# Patient Record
Sex: Female | Born: 1990 | Race: White | Hispanic: No | Marital: Single | Smoking: Never smoker
Health system: Southern US, Community
[De-identification: ages and names within clinical notes are randomized; demographics above are authoritative.]

## PROBLEM LIST (undated history)

## (undated) DIAGNOSIS — Z789 Other specified health status: Secondary | ICD-10-CM

## (undated) DIAGNOSIS — B009 Herpesviral infection, unspecified: Secondary | ICD-10-CM

## (undated) HISTORY — DX: Herpesviral infection, unspecified: B00.9

## (undated) HISTORY — DX: Other specified health status: Z78.9

## (undated) HISTORY — PX: WISDOM TOOTH EXTRACTION: SHX21

---

## 2016-05-02 DIAGNOSIS — N390 Urinary tract infection, site not specified: Secondary | ICD-10-CM | POA: Diagnosis not present

## 2017-05-17 ENCOUNTER — Other Ambulatory Visit: Payer: Self-pay

## 2017-05-17 ENCOUNTER — Ambulatory Visit: Payer: BLUE CROSS/BLUE SHIELD | Admitting: Family Medicine

## 2017-05-17 ENCOUNTER — Encounter: Payer: Self-pay | Admitting: Family Medicine

## 2017-05-17 VITALS — BP 100/62 | HR 105 | Temp 98.0°F | Resp 16 | Ht 62.0 in | Wt 118.0 lb

## 2017-05-17 DIAGNOSIS — J069 Acute upper respiratory infection, unspecified: Secondary | ICD-10-CM | POA: Diagnosis not present

## 2017-05-17 DIAGNOSIS — H66003 Acute suppurative otitis media without spontaneous rupture of ear drum, bilateral: Secondary | ICD-10-CM

## 2017-05-17 MED ORDER — FLUTICASONE PROPIONATE 50 MCG/ACT NA SUSP: 2.0000 | Freq: Every day | NASAL | 6 refills | Status: DC

## 2017-05-17 MED ORDER — CEFDINIR 300 MG PO CAPS: 600.0000 mg | ORAL_CAPSULE | Freq: Every day | ORAL | 0 refills | Status: DC

## 2017-05-17 NOTE — Progress Notes (Signed)
Patient ID: Ashley KeelerHeather Greene, female    DOB: 1991/04/15  Age: 26 y.o. MRN: [id number]  Chief Complaint  Patient presents with  . Ear Pain    x 3 days     Subjective:   26 year old lady with right ear pain, and now some on the left. She had a little cold like an infection. She has had some headache congestion and cough, but nothing severe. She did have fevers. She has a history of a lot of ear infections as a child. 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 She has a history of having had her eardrum rupture on several occasions. She does not feel like she needs anything major pain at this time.  Current allergies, medications, problem list, past/family and social histories reviewed.  Objective:  BP 100/62   Pulse (!) 105   Temp 98 F (36.7 C)   Resp 16   Ht 5\' 2"  (1.575 m)   Wt 118 lb (53.5 kg)   LMP 04/22/2017   SpO2 98%   BMI 21.58 kg/m   Healthy-appearing young lady. Bulging right eardrum with erythema. Left eardrum is erythematous primarily superiorly, not suppurative like the right. Nose slightly congested. Throat clear. Neck supple without significant nodes. Chest clear. Heart regular without murmur.  Assessment & Plan:   Assessment: 1. Acute suppurative otitis media of both ears without spontaneous rupture of tympanic membranes, recurrence not specified   2. Acute upper respiratory infection       Plan: Treat for the acute otitis media. Return if needed.  No orders of the defined types were placed in this encounter.   Meds ordered this encounter  Medications  . cefdinir (OMNICEF) 300 MG capsule    Sig: Take 2 capsules (600 mg total) by mouth daily.    Dispense:  20 capsule    Refill:  0  . fluticasone (FLONASE) 50 MCG/ACT nasal spray    Sig: Place 2 sprays into both nostrils daily.    Dispense:  16 g    Refill:  6         Patient Instructions   Drink plenty of fluids to stay well hydrated  If necessary take  Tylenol (acetaminophen) 500 mg 2 pills maximum of 3 times daily or ibuprofen 200 mg 3 pills 3 times daily for pain or fever.  Omnicef (cefdinir) 300 mg pill twice daily for infection  Use the fluticasone nose spray 2 sprays each nostril twice daily for 3 days, then reduce to once daily  Return if worse or further concerns.    IF you received an x-ray today, you will receive an invoice from [facility]. Please contact [facility] at [phone] with questions or concerns regarding your invoice.   IF you received labwork today, you will receive an invoice from [facility]. Please contact [facility] at [phone] with questions or concerns regarding your invoice.   Our billing staff will not be able to assist you with questions regarding bills from these companies.  You will be contacted with the lab results as soon as they are available. The fastest way to get your results is to activate your My Chart account. Instructions are located on the last page of this paperwork. If you have not heard from us regarding the results in 2 weeks, please contact this office.         Return if symptoms worsen or fail to improve.   HOPPER,DAVID, MD 05/17/2017

## 2017-05-17 NOTE — Patient Instructions (Addendum)
Drink plenty of fluids to stay well hydrated  If necessary take Tylenol (acetaminophen) 500 mg 2 pills maximum of 3 times daily or ibuprofen 200 mg 3 pills 3 times daily for pain or fever.  Omnicef (cefdinir) 300 mg pill twice daily for infection  Use the fluticasone nose spray 2 sprays each nostril twice daily for 3 days, then reduce to once daily  Return if worse or further concerns.    IF you received an x-ray today, you will receive an invoice from [facility]. Please contact [facility] at [phone] with questions or concerns regarding your invoice.   IF you received labwork today, you will receive an invoice from [facility]. Please contact [facility] at [phone] with questions or concerns regarding your invoice.   Our billing staff will not be able to assist you with questions regarding bills from these companies.  You will be contacted with the lab results as soon as they are available. The fastest way to get your results is to activate your My Chart account. Instructions are located on the last page of this paperwork. If you have not heard from us regarding the results in 2 weeks, please contact this office.

## 2017-11-16 DIAGNOSIS — N390 Urinary tract infection, site not specified: Secondary | ICD-10-CM | POA: Diagnosis not present

## 2018-05-24 ENCOUNTER — Other Ambulatory Visit: Payer: Self-pay

## 2018-05-24 ENCOUNTER — Encounter (HOSPITAL_COMMUNITY): Payer: Self-pay | Admitting: Emergency Medicine

## 2018-05-24 ENCOUNTER — Ambulatory Visit (HOSPITAL_COMMUNITY)
Admission: EM | Admit: 2018-05-24 | Discharge: 2018-05-24 | Disposition: A | Discharge status: Home / Self Care | Payer: BLUE CROSS/BLUE SHIELD

## 2018-05-24 DIAGNOSIS — R69 Illness, unspecified: Secondary | ICD-10-CM | POA: Diagnosis not present

## 2018-05-24 DIAGNOSIS — H1032 Unspecified acute conjunctivitis, left eye: Secondary | ICD-10-CM | POA: Diagnosis not present

## 2018-05-24 DIAGNOSIS — J111 Influenza due to unidentified influenza virus with other respiratory manifestations: Secondary | ICD-10-CM

## 2018-05-24 MED ORDER — OSELTAMIVIR PHOSPHATE 75 MG PO CAPS: 75.0000 mg | ORAL_CAPSULE | Freq: Two times a day (BID) | ORAL | 0 refills | Status: DC

## 2018-05-24 MED ORDER — ERYTHROMYCIN 5 MG/GM OP OINT: TOPICAL_OINTMENT | OPHTHALMIC | 0 refills | Status: DC

## 2018-05-24 MED ORDER — IBUPROFEN 600 MG PO TABS: 600.0000 mg | ORAL_TABLET | Freq: Four times a day (QID) | ORAL | 0 refills | Status: DC | PRN

## 2018-05-24 MED ORDER — ACETAMINOPHEN 325 MG PO TABS
650.0000 mg | ORAL_TABLET | Freq: Once | ORAL | Status: AC
  Administered 2018-05-24: 650 mg via ORAL

## 2018-05-24 MED ORDER — ACETAMINOPHEN 325 MG PO TABS
ORAL_TABLET | ORAL | Status: AC
  Filled 2018-05-24: qty 2

## 2018-05-24 NOTE — ED Provider Notes (Signed)
[facility]    CSN: [id number] Arrival date & time: 05/24/18  1248     History   Chief Complaint Chief Complaint  Patient presents with  . URI  . Appointment    1:00pm    HPI Ashley Greene is a 27 y.o. female.   Subjective:   Ashley Greene is a 27 y.o. female who presents for evaluation of influenza like symptoms. Symptoms include low grade fevers as well as hot and cold spells, chills and myalgias and have been present for 2 days. She has tried to alleviate the symptoms with rest and dayquil with some relief. High risk factors for influenza complications: none.   Ashley Greene also complaints redness in the left eye for 1 day. Onset was acute. Symptoms have included discharge and itching. Patient denies blurred vision, pain, photophobia and visual field deficit. There is no history of allergies, contact lens use, exposure to chemicals, trauma or contacts with similar symptoms.   The following portions of the patient's history were reviewed and updated as appropriate: allergies, current medications, past family history, past medical history, past social history, past surgical history and problem list.        History reviewed. No pertinent past medical history.  There are no active problems to display for this patient.   History reviewed. No pertinent surgical history.  OB History   None      Home Medications    Prior to Admission medications   Medication Sig Start Date End Date Taking? Authorizing Provider  Pseudoephedrine-APAP-DM (DAYQUIL PO) Take by mouth.   Yes [provider]  erythromycin ophthalmic ointment Place a 1/2 inch ribbon of ointment into the left lower eyelid four times daily x 5 days 05/24/18   Lurline Idol, FNP  ibuprofen (ADVIL,MOTRIN) 600 MG tablet Take 1 tablet (600 mg total) by mouth every 6 (six) hours as needed. 05/24/18   Lurline Idol, FNP  oseltamivir (TAMIFLU) 75 MG capsule Take 1 capsule (75 mg total) by  mouth every 12 (twelve) hours. 05/24/18   Lurline Idol, FNP    Family History Family History  Problem Relation Age of Onset  . Healthy Mother   . Healthy Father     Social History Social History   Tobacco Use  . Smoking status: Never Smoker  . Smokeless tobacco: Never Used  Substance Use Topics  . Alcohol use: No    Frequency: Never  . Drug use: No     Allergies   Patient has no known allergies.   Review of Systems Review of Systems  Constitutional: Positive for fever.  HENT: Negative.   Eyes: Positive for discharge and redness.  Respiratory: Negative.   Musculoskeletal: Positive for myalgias.  All other systems reviewed and are negative.    Physical Exam Triage Vital Signs ED Triage Vitals  Enc Vitals Group     BP 05/24/18 1314 109/61     Pulse Rate 05/24/18 1314 (!) 111     Resp 05/24/18 1314 18     Temp 05/24/18 1314 (!) 100.6 F (38.1 C)     Temp Source 05/24/18 1314 Oral     SpO2 05/24/18 1314 100 %     Weight --      Height --      Head Circumference --      Peak Flow --      Pain Score 05/24/18 1311 2     Pain Loc --      Pain Edu? --  Excl. in GC? --    No data found.  Updated Vital Signs BP 109/61 (BP Location: Left Arm)   Pulse (!) 111   Temp (!) 100.6 F (38.1 C) (Oral)   Resp 18   LMP 05/14/2018   SpO2 100%   Visual Acuity Right Eye Distance: 20/20 Left Eye Distance: 20/20 Bilateral Distance: 20/15  Right Eye Near:   Left Eye Near:    Bilateral Near:     Physical Exam  Constitutional: She is oriented to person, place, and time. She appears well-developed and well-nourished.  HENT:  Head: Normocephalic.  Right Ear: External ear normal.  Left Ear: External ear normal.  Nose: Nose normal.  Mouth/Throat: Oropharynx is clear and moist.  Eyes: Pupils are equal, round, and reactive to light. EOM are normal. Left conjunctiva is injected.  Neck: Normal range of motion. Neck supple.  Cardiovascular: Normal rate and  regular rhythm.  Pulmonary/Chest: Effort normal and breath sounds normal.  Musculoskeletal: Normal range of motion.  Lymphadenopathy:    She has no cervical adenopathy.  Neurological: She is alert and oriented to person, place, and time.  Skin: Skin is warm and dry.  Psychiatric: She has a normal mood and affect.     UC Treatments / Results  Labs (all labs ordered are listed, but only abnormal results are displayed) Labs Reviewed - No data to display  EKG None  Radiology No results found.  Procedures Procedures (including critical care time)  Medications Ordered in UC Medications  acetaminophen (TYLENOL) tablet 650 mg (has no administration in time range)    Initial Impression / Assessment and Plan / UC Course  I have reviewed the triage vital signs and the nursing notes.  Pertinent labs & imaging results that were available during my care of the patient were reviewed by me and considered in my medical decision making (see chart for details).     27 year old female presenting with influenza-like symptoms and left eye conjunctivitis.  Patient is febrile and mildly tachycardic.  She is nontoxic-appearing and the remainder of her exam is unremarkable.  Supportive care advised at this time.   Plan:  Ophthalmic ointment per orders. Supportive care with appropriate antipyretics and fluids. Educational material distributed and questions answered. Antivirals per orders. Follow-up as needed   Today's evaluation has revealed no signs of a dangerous process. Discussed diagnosis with patient. Patient aware of their diagnosis, possible red flag symptoms to watch out for and need for close follow up. Patient understands verbal and written discharge instructions. Patient comfortable with plan and disposition.  Patient has a clear mental status at this time, good insight into illness (after discussion and teaching) and has clear judgment to make decisions regarding their  care.  Documentation was completed with the aid of voice recognition software. Transcription may contain typographical errors. Final Clinical Impressions(s) / UC Diagnoses   Final diagnoses:  Influenza-like illness  Acute conjunctivitis of left eye, unspecified acute conjunctivitis type     Discharge Instructions     Take medications as prescribed.  Rest and drink lots of fluids.  Follow-up as needed.    ED Prescriptions    Medication Sig Dispense Auth. Provider   oseltamivir (TAMIFLU) 75 MG capsule Take 1 capsule (75 mg total) by mouth every 12 (twelve) hours. 10 capsule Lurline IdolMurrill, Ovila Lepage, FNP   erythromycin ophthalmic ointment Place a 1/2 inch ribbon of ointment into the left lower eyelid four times daily x 5 days 1 g Lurline IdolMurrill, Suri Tafolla, FNP   ibuprofen (  ADVIL,MOTRIN) 600 MG tablet Take 1 tablet (600 mg total) by mouth every 6 (six) hours as needed. 30 tablet Lurline Idol, FNP     Controlled Substance Prescriptions [place] Controlled Substance Registry consulted? Not Applicable   Lurline Idol, FNP 05/24/18 1344

## 2018-05-24 NOTE — Discharge Instructions (Addendum)
Take medications as prescribed.  Rest and drink lots of fluids.  Follow-up as needed.

## 2018-05-24 NOTE — ED Triage Notes (Signed)
Left eye redness and irritation.  Eye symptoms started last night.  No vision changes.  No known injury   Chills, fever, initially had a sore throat.  Patient reports temperature "99".

## 2018-06-26 DIAGNOSIS — R5383 Other fatigue: Secondary | ICD-10-CM | POA: Diagnosis not present

## 2018-06-27 DIAGNOSIS — N760 Acute vaginitis: Secondary | ICD-10-CM | POA: Diagnosis not present

## 2018-06-27 DIAGNOSIS — Z124 Encounter for screening for malignant neoplasm of cervix: Secondary | ICD-10-CM | POA: Diagnosis not present

## 2019-02-27 DIAGNOSIS — B373 Candidiasis of vulva and vagina: Secondary | ICD-10-CM | POA: Diagnosis not present

## 2019-05-23 DIAGNOSIS — N760 Acute vaginitis: Secondary | ICD-10-CM | POA: Diagnosis not present

## 2019-05-24 DIAGNOSIS — S161XXA Strain of muscle, fascia and tendon at neck level, initial encounter: Secondary | ICD-10-CM | POA: Diagnosis not present

## 2019-06-06 ENCOUNTER — Ambulatory Visit: Payer: BLUE CROSS/BLUE SHIELD | Admitting: Family Medicine

## 2019-06-17 DIAGNOSIS — H5213 Myopia, bilateral: Secondary | ICD-10-CM | POA: Diagnosis not present

## 2019-06-21 NOTE — L&D Delivery Note (Addendum)
Ashley Greene is a 29 y.o. G1P1001 s/p vaginal delivery at [redacted]w[redacted]d.  She was admitted for labor.   ROM: 4h 65m with light meconium stained fluid GBS Status: positive (pt denies antibiotics and has been educated on risks) Maximum Maternal Temperature: 98.5 F   Labor Progress: Pt in latent labor on admission.  She continued to progress well without augmentation. Given pt's desire to augment, AROM was performed for light meconium stained fluid. Patient continued to progress well. She then delivered without complication as noted below.   Delivery Date/Time: 05/20/2020 @ 1201 Delivery: Called to room and patient was complete and pushing. Head delivered ROA. No nuchal cord present. Shoulder and body delivered in usual fashion. Infant with spontaneous cry, placed on mother's abdomen, dried and stimulated. Cord clamped x 2 after 1-minute delay, and cut by FOB under my direct supervision. Cord blood drawn. Placenta delivered spontaneously with gentle cord traction. Fundus firm with massage. Labia, perineum, vagina, and cervix were inspected; hemostatic right periurethral abrasion and hemostatic left labial abrasion; no repair needed.    Placenta: intact, 3-vessel cord, sent to L&D Complications: none Lacerations: hemostatic right periurethral abrasion and hemostatic left labial abrasion; no repair needed EBL: 100 ml Analgesia: epidural   Infant: female  APGARs 8 & 9  weight per medical record  Colman Cater, Student-PA  05/20/2020 @1236   I was present and gloved for the entirety of the delivery. I agree with the findings and the plan of care as documented in the PA student's note.  , MD [facility] Family Medicine Fellow, [facility] for [facility], [facility]

## 2019-06-28 ENCOUNTER — Ambulatory Visit: Payer: Self-pay | Admitting: Family Medicine

## 2019-07-07 DIAGNOSIS — A6003 Herpesviral cervicitis: Secondary | ICD-10-CM | POA: Diagnosis not present

## 2019-07-11 DIAGNOSIS — B009 Herpesviral infection, unspecified: Secondary | ICD-10-CM | POA: Diagnosis not present

## 2019-07-17 DIAGNOSIS — R21 Rash and other nonspecific skin eruption: Secondary | ICD-10-CM | POA: Diagnosis not present

## 2019-07-17 DIAGNOSIS — N760 Acute vaginitis: Secondary | ICD-10-CM | POA: Diagnosis not present

## 2019-07-18 MED FILL — TINIDAZOLE 500 MG TABS: 500 | 5 days supply | Qty: 10 | Fill #0

## 2019-07-19 DIAGNOSIS — L42 Pityriasis rosea: Secondary | ICD-10-CM | POA: Diagnosis not present

## 2019-08-05 DIAGNOSIS — B373 Candidiasis of vulva and vagina: Secondary | ICD-10-CM | POA: Diagnosis not present

## 2019-08-05 DIAGNOSIS — A6004 Herpesviral vulvovaginitis: Secondary | ICD-10-CM | POA: Diagnosis not present

## 2019-08-06 DIAGNOSIS — R5383 Other fatigue: Secondary | ICD-10-CM | POA: Diagnosis not present

## 2019-08-07 DIAGNOSIS — R5383 Other fatigue: Secondary | ICD-10-CM | POA: Diagnosis not present

## 2019-08-08 DIAGNOSIS — A6004 Herpesviral vulvovaginitis: Secondary | ICD-10-CM | POA: Diagnosis not present

## 2019-08-12 DIAGNOSIS — N898 Other specified noninflammatory disorders of vagina: Secondary | ICD-10-CM | POA: Diagnosis not present

## 2019-09-24 DIAGNOSIS — Z3201 Encounter for pregnancy test, result positive: Secondary | ICD-10-CM | POA: Diagnosis not present

## 2019-10-09 ENCOUNTER — Telehealth: Payer: Self-pay | Admitting: Family Medicine

## 2019-10-09 DIAGNOSIS — L42 Pityriasis rosea: Secondary | ICD-10-CM | POA: Diagnosis not present

## 2019-10-09 NOTE — Telephone Encounter (Signed)
Patient called in the office stating that she thinks her appointment got scheduled at the wrong office. She stated that it is scheduled at the [facility] office but she lives here in [place] and would like to come the this office instead because it is closer. Patient advised that our intake appointment schedule is not out yet and once it is I can give her a call back to get these appointments set up for her. Patient wanted to know if she could keep her appointment in [facility] on 4/27 and then have the rest of her appointments at our office. Patient instructed that this could be done and once she has her appointment with [facility] to give the office a call do that we can have her future appointments at our office. Patient verbalized understanding and no further questions were had

## 2019-10-15 ENCOUNTER — Other Ambulatory Visit: Payer: Self-pay

## 2019-10-15 ENCOUNTER — Encounter: Payer: Self-pay | Admitting: Advanced Practice Midwife

## 2019-10-15 ENCOUNTER — Other Ambulatory Visit (HOSPITAL_COMMUNITY)
Admission: RE | Admit: 2019-10-15 | Discharge: 2019-10-15 | Disposition: A | Discharge status: Home / Self Care | Payer: BC Managed Care – PPO | Source: Ambulatory Visit | Attending: Advanced Practice Midwife | Admitting: Advanced Practice Midwife

## 2019-10-15 ENCOUNTER — Ambulatory Visit (INDEPENDENT_AMBULATORY_CARE_PROVIDER_SITE_OTHER): Payer: BC Managed Care – PPO | Admitting: Advanced Practice Midwife

## 2019-10-15 DIAGNOSIS — B009 Herpesviral infection, unspecified: Secondary | ICD-10-CM | POA: Diagnosis not present

## 2019-10-15 DIAGNOSIS — Z3400 Encounter for supervision of normal first pregnancy, unspecified trimester: Secondary | ICD-10-CM | POA: Diagnosis not present

## 2019-10-15 HISTORY — DX: Encounter for supervision of normal first pregnancy, unspecified trimester: Z34.00

## 2019-10-15 NOTE — Progress Notes (Signed)
  Subjective:    Ashley Greene is a G1P0 [redacted]w[redacted]d being seen today for her first obstetrical visit.  Her obstetrical history is significant for primigravida, history of HSV. Patient does intend to breast feed. Pregnancy history fully reviewed.  Patient reports no complaints.   Prefers to go to [facility], but made a mistake when making her appointment.   Vitals:   10/15/19 1051  BP: (!) 93/57  Pulse: 77  Weight: 112 lb 1.3 oz (50.8 kg)    HISTORY: OB History  Gravida Para Term Preterm AB Living  1            SAB TAB Ectopic Multiple Live Births               # Outcome Date GA Lbr Len/2nd Weight Sex Delivery Anes PTL Lv  1 Current            History reviewed. No pertinent past medical history. History reviewed. No pertinent surgical history. Family History  Problem Relation Age of Onset  . Healthy Mother   . Healthy Father      Exam    Uterus:     Pelvic Exam:    Perineum: Normal Perineum   Vulva: Bartholin's, Urethra, Skene's normal   Vagina:  normal mucosa, normal discharge   pH:    Cervix: no cervical motion tenderness   Adnexa: no mass, fullness, tenderness   Bony Pelvis: gynecoid  System: Breast:  normal appearance, no masses or tenderness   Skin: normal coloration and turgor, no rashes    Neurologic: oriented, grossly non-focal   Extremities: normal strength, tone, and muscle mass   HEENT neck supple with midline trachea   Mouth/Teeth mucous membranes moist, pharynx normal without lesions   Neck supple   Cardiovascular: regular rate and rhythm   Respiratory:  appears well, vitals normal, no respiratory distress, acyanotic, normal RR, ear and throat exam is normal, neck free of mass or lymphadenopathy, chest clear, no wheezing, crepitations, rhonchi, normal symmetric air entry   Abdomen: soft, non-tender; bowel sounds normal; no masses,  no organomegaly   Urinary: urethral meatus normal   Bedside informal US done to obtaine FHR.  FH motion detected  at 150. + fetal movement.    Assessment:    Pregnancy: G1P0 Patient Active Problem List   Diagnosis Date Noted  . Supervision of normal first pregnancy, antepartum 10/15/2019  . 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 Continue prenatal vitamins. Discussed and offered genetic screening options, including Quad screen/AFP, NIPS testing, and option to decline testing. Benefits/risks/alternatives reviewed. Pt aware that anatomy US is form of genetic screening with lower accuracy in detecting trisomies than blood work.  Pt chooses genetic screening today. NIPS: ordered. Ultrasound discussed; fetal anatomic survey: ordered. Problem list reviewed and updated. The nature of [facility] with multiple MDs and other Advanced Practice Providers was explained to patient; also emphasized that residents, students are part of our team. Routine obstetric precautions reviewed.     Follow up in 4 weeks. 50% of 30 min visit spent on counseling and coordination of care.     Wynelle Bourgeois 10/15/2019

## 2019-10-15 NOTE — Patient Instructions (Signed)
Genetic Testing During Pregnancy Genetic testing during pregnancy is also called prenatal genetic testing. This type of testing can determine if your baby is at risk of being born with a disorder caused by abnormal genes or chromosomes (genetic disorder). Chromosomes contain genes that control how your baby will develop in your womb. There are many different genetic disorders. Examples of genetic disorders that may be found through genetic testing include Down syndrome and cystic fibrosis. Gene changes (mutations) can be passed down through families. Genetic testing is offered to all women before or during pregnancy. You can choose whether to have genetic testing. Why is genetic testing done? Genetic testing is done during pregnancy to find out whether your child is at risk for a genetic disorder. Having genetic testing allows you to:  Discuss your test results and options with a genetic counselor.  Prepare for a baby that may be born with a genetic disorder. Learning about the disorder ahead of time helps you be better prepared to manage it. Your health care providers can also be prepared in case your baby requires special care before or after birth.  Consider whether you want to continue with the pregnancy. In some cases, genetic testing may be done to learn about the traits a child will inherit. Types of genetic tests There are two basic types of genetic testing. Screening tests indicate whether your developing baby (fetus) is at higher risk for a genetic disorder. Diagnostic tests check actual fetal cells to diagnose a genetic disorder. Screening tests     Screening tests will not harm your baby. They are recommended for all pregnant women. Types of screening tests include:  Carrier screening. This test involves checking genes from both parents by testing their blood or saliva. The test checks to find out if the parents carry a genetic mutation that may be passed to a baby. In most cases,  both parents must carry the mutation for a baby to be at risk.  First trimester screening. This test combines a blood test with sound wave imaging of your baby (fetal ultrasound). This screening test checks for a risk of Down syndrome or other defects caused by having extra chromosomes. It also checks for defects of the heart, abdomen, or skeleton.  Second trimester screening also combines a blood test with a fetal ultrasound exam. It checks for a risk of genetic defects of the face, brain, spine, heart, or limbs.  Combined or sequential screening. This type of testing combines the results of first and second trimester screening. This type of testing may be more accurate than first or second trimester screening alone.  Cell-free DNA testing. This is a blood test that detects cells released by the placenta that get into the mother's blood. It can be used to check for a risk of Down syndrome, other extra chromosome syndromes, and disorders caused by abnormal numbers of sex chromosomes. This test can be done any time after 10 weeks of pregnancy.  Diagnostic tests Diagnostic tests carry slight risks of problems, including bleeding, infection, and loss of the pregnancy. These tests are done only if your baby is at risk for a genetic disorder. You may meet with a genetic counselor to discuss the risks and benefits before having diagnostic tests. Examples of diagnostic tests include:  Chorionic villus sampling (CVS). This involves a procedure to remove and test a sample of cells taken from the placenta. The procedure may be done between 10 and 12 weeks of pregnancy.  Amniocentesis. This involves a   procedure to remove and test a sample of fluid (amniotic fluid) and cells from the sac that surrounds the developing baby. The procedure may be done between 15 and 20 weeks of pregnancy. What do the results mean? For a screening test:  If the results are negative, it often means that your child is not at higher  risk. There is still a slight chance your child could have a genetic disorder.  If the results are positive, it does not mean your child will have a genetic disorder. It may mean that your child has a higher-than-normal risk for a genetic disorder. In that case, you may want to talk with a genetic counselor about whether you should have diagnostic genetic tests. For a diagnostic test:  If the result is negative, it is unlikely that your child will have a genetic disorder.  If the test is positive for a genetic disorder, it is likely that your child will have the disorder. The test may not tell how severe the disorder will be. Talk with your health care provider about your options. Questions to ask your health care provider Before talking to your health care provider about genetic testing, find out if there is a history of genetic disorders in your family. It may also help to know your family's ethnic origins. Then ask your health care provider the following questions:  Is my baby at risk for a genetic disorder?  What are the benefits of having genetic screening?  What tests are best for me and my baby?  What are the risks of each test?  If I get a positive result on a screening test, what is the next step?  Should I meet with a genetic counselor before having a diagnostic test?  Should my partner or other members of my family be tested?  How much do the tests cost? Will my insurance cover the testing? Summary  Genetic testing is done during pregnancy to find out whether your child is at risk for a genetic disorder.  Genetic testing is offered to all women before or during pregnancy. You can choose whether to have genetic testing.  There are two basic types of genetic testing. Screening tests indicate whether your developing baby (fetus) is at higher risk for a genetic disorder. Diagnostic tests check actual fetal cells to diagnose a genetic disorder.  If a diagnostic genetic test is  positive, talk with your health care provider about your options. This information is not intended to replace advice given to you by your health care provider. Make sure you discuss any questions you have with your health care provider. Document Revised: 09/27/2018 Document Reviewed: 08/21/2017 [employer] Patient Education  2020 [employer] First Trimester of Pregnancy The first trimester of pregnancy is from week 1 until the end of week 13 (months 1 through 3). A week after a sperm fertilizes an egg, the egg will implant on the wall of the uterus. This embryo will begin to develop into a baby. Genes from you and your partner will form the baby. The female genes will determine whether the baby will be a boy or a girl. At 6-8 weeks, the eyes and face will be formed, and the heartbeat can be seen on ultrasound. At the end of 12 weeks, all the baby's organs will be formed. Now that you are pregnant, you will want to do everything you can to have a healthy baby. Two of the most important things are to get good prenatal care and to   follow your health care provider's instructions. Prenatal care is all the medical care you receive before the baby's birth. This care will help prevent, find, and treat any problems during the pregnancy and childbirth. Body changes during your first trimester Your body goes through many changes during pregnancy. The changes vary from woman to woman.  You may gain or lose a couple of pounds at first.  You may feel sick to your stomach (nauseous) and you may throw up (vomit). If the vomiting is uncontrollable, call your health care provider.  You may tire easily.  You may develop headaches that can be relieved by medicines. All medicines should be approved by your health care provider.  You may urinate more often. Painful urination may mean you have a bladder infection.  You may develop heartburn as a result of your pregnancy.  You may develop constipation because certain  hormones are causing the muscles that push stool through your intestines to slow down.  You may develop hemorrhoids or swollen veins (varicose veins).  Your breasts may begin to grow larger and become tender. Your nipples may stick out more, and the tissue that surrounds them (areola) may become darker.  Your gums may bleed and may be sensitive to brushing and flossing.  Dark spots or blotches (chloasma, mask of pregnancy) may develop on your face. This will likely fade after the baby is born.  Your menstrual periods will stop.  You may have a loss of appetite.  You may develop cravings for certain kinds of food.  You may have changes in your emotions from day to day, such as being excited to be pregnant or being concerned that something may go wrong with the pregnancy and baby.  You may have more vivid and strange dreams.  You may have changes in your hair. These can include thickening of your hair, rapid growth, and changes in texture. Some women also have hair loss during or after pregnancy, or hair that feels dry or thin. Your hair will most likely return to normal after your baby is born. What to expect at prenatal visits During a routine prenatal visit:  You will be weighed to make sure you and the baby are growing normally.  Your blood pressure will be taken.  Your abdomen will be measured to track your baby's growth.  The fetal heartbeat will be listened to between weeks 10 and 14 of your pregnancy.  Test results from any previous visits will be discussed. Your health care provider may ask you:  How you are feeling.  If you are feeling the baby move.  If you have had any abnormal symptoms, such as leaking fluid, bleeding, severe headaches, or abdominal cramping.  If you are using any tobacco products, including cigarettes, chewing tobacco, and electronic cigarettes.  If you have any questions. Other tests that may be performed during your first trimester  include:  Blood tests to find your blood type and to check for the presence of any previous infections. The tests will also be used to check for low iron levels (anemia) and protein on red blood cells (Rh antibodies). Depending on your risk factors, or if you previously had diabetes during pregnancy, you may have tests to check for high blood sugar that affects pregnant women (gestational diabetes).  Urine tests to check for infections, diabetes, or protein in the urine.  An ultrasound to confirm the proper growth and development of the baby.  Fetal screens for spinal cord problems (spina bifida) and   Down syndrome.  HIV (human immunodeficiency virus) testing. Routine prenatal testing includes screening for HIV, unless you choose not to have this test.  You may need other tests to make sure you and the baby are doing well. Follow these instructions at home: Medicines  Follow your health care provider's instructions regarding medicine use. Specific medicines may be either safe or unsafe to take during pregnancy.  Take a prenatal vitamin that contains at least 600 micrograms (mcg) of folic acid.  If you develop constipation, try taking a stool softener if your health care provider approves. Eating and drinking   Eat a balanced diet that includes fresh fruits and vegetables, whole grains, good sources of protein such as meat, eggs, or tofu, and low-fat dairy. Your health care provider will help you determine the amount of weight gain that is right for you.  Avoid raw meat and uncooked cheese. These carry germs that can cause birth defects in the baby.  Eating four or five small meals rather than three large meals a day may help relieve nausea and vomiting. If you start to feel nauseous, eating a few soda crackers can be helpful. Drinking liquids between meals, instead of during meals, also seems to help ease nausea and vomiting.  Limit foods that are high in fat and processed sugars, such  as fried and sweet foods.  To prevent constipation: ? Eat foods that are high in fiber, such as fresh fruits and vegetables, whole grains, and beans. ? Drink enough fluid to keep your urine clear or pale yellow. Activity  Exercise only as directed by your health care provider. Most women can continue their usual exercise routine during pregnancy. Try to exercise for 30 minutes at least 5 days a week. Exercising will help you: ? Control your weight. ? Stay in shape. ? Be prepared for labor and delivery.  Experiencing pain or cramping in the lower abdomen or lower back is a good sign that you should stop exercising. Check with your health care provider before continuing with normal exercises.  Try to avoid standing for long periods of time. Move your legs often if you must stand in one place for a long time.  Avoid heavy lifting.  Wear low-heeled shoes and practice good posture.  You may continue to have sex unless your health care provider tells you not to. Relieving pain and discomfort  Wear a good support bra to relieve breast tenderness.  Take warm sitz baths to soothe any pain or discomfort caused by hemorrhoids. Use hemorrhoid cream if your health care provider approves.  Rest with your legs elevated if you have leg cramps or low back pain.  If you develop varicose veins in your legs, wear support hose. Elevate your feet for 15 minutes, 3-4 times a day. Limit salt in your diet. Prenatal care  Schedule your prenatal visits by the twelfth week of pregnancy. They are usually scheduled monthly at first, then more often in the last 2 months before delivery.  Write down your questions. Take them to your prenatal visits.  Keep all your prenatal visits as told by your health care provider. This is important. Safety  Wear your seat belt at all times when driving.  Make a list of emergency phone numbers, including numbers for family, friends, the hospital, and police and fire  departments. General instructions  Ask your health care provider for a referral to a local prenatal education class. Begin classes no later than the beginning of month 6 of your   pregnancy.  Ask for help if you have counseling or nutritional needs during pregnancy. Your health care provider can offer advice or refer you to specialists for help with various needs.  Do not use hot tubs, steam rooms, or saunas.  Do not douche or use tampons or scented sanitary pads.  Do not cross your legs for long periods of time.  Avoid cat litter boxes and soil used by cats. These carry germs that can cause birth defects in the baby and possibly loss of the fetus by miscarriage or stillbirth.  Avoid all smoking, herbs, alcohol, and medicines not prescribed by your health care provider. Chemicals in these products affect the formation and growth of the baby.  Do not use any products that contain nicotine or tobacco, such as cigarettes and e-cigarettes. If you need help quitting, ask your health care provider. You may receive counseling support and other resources to help you quit.  Schedule a dentist appointment. At home, brush your teeth with a soft toothbrush and be gentle when you floss. Contact a health care provider if:  You have dizziness.  You have mild pelvic cramps, pelvic pressure, or nagging pain in the abdominal area.  You have persistent nausea, vomiting, or diarrhea.  You have a bad smelling vaginal discharge.  You have pain when you urinate.  You notice increased swelling in your face, hands, legs, or ankles.  You are exposed to fifth disease or chickenpox.  You are exposed to German measles (rubella) and have never had it. Get help right away if:  You have a fever.  You are leaking fluid from your vagina.  You have spotting or bleeding from your vagina.  You have severe abdominal cramping or pain.  You have rapid weight gain or loss.  You vomit blood or material that  looks like coffee grounds.  You develop a severe headache.  You have shortness of breath.  You have any kind of trauma, such as from a fall or a car accident. Summary  The first trimester of pregnancy is from week 1 until the end of week 13 (months 1 through 3).  Your body goes through many changes during pregnancy. The changes vary from woman to woman.  You will have routine prenatal visits. During those visits, your health care provider will examine you, discuss any test results you may have, and talk with you about how you are feeling. This information is not intended to replace advice given to you by your health care provider. Make sure you discuss any questions you have with your health care provider. Document Revised: 05/19/2017 Document Reviewed: 05/18/2016 [employer] Patient Education  2020 [employer]  

## 2019-10-16 LAB — CERVICOVAGINAL ANCILLARY ONLY
Chlamydia: NEGATIVE
Comment: NEGATIVE
Comment: NORMAL
Neisseria Gonorrhea: NEGATIVE

## 2019-10-17 LAB — CULTURE, OB URINE

## 2019-10-17 LAB — URINE CULTURE, OB REFLEX

## 2019-10-18 ENCOUNTER — Telehealth: Payer: Self-pay

## 2019-10-18 NOTE — Telephone Encounter (Signed)
Patient called for lab results.  Patient was not able to go to the lab for blood draw and plans to return on Monday May 3rd. Armandina Stammer RN

## 2019-11-05 DIAGNOSIS — Z3400 Encounter for supervision of normal first pregnancy, unspecified trimester: Secondary | ICD-10-CM | POA: Diagnosis not present

## 2019-11-05 DIAGNOSIS — B009 Herpesviral infection, unspecified: Secondary | ICD-10-CM | POA: Diagnosis not present

## 2019-11-06 LAB — HEPATITIS C ANTIBODY: Hep C Virus Ab: <0.1 s/co ratio (ref 0.0–0.9)

## 2019-11-11 ENCOUNTER — Other Ambulatory Visit: Payer: Self-pay

## 2019-11-11 ENCOUNTER — Ambulatory Visit (INDEPENDENT_AMBULATORY_CARE_PROVIDER_SITE_OTHER): Payer: BC Managed Care – PPO | Admitting: Advanced Practice Midwife

## 2019-11-11 VITALS — BP 103/69 | HR 93 | Wt 115.6 lb

## 2019-11-11 DIAGNOSIS — O26899 Other specified pregnancy related conditions, unspecified trimester: Secondary | ICD-10-CM | POA: Insufficient documentation

## 2019-11-11 DIAGNOSIS — Z789 Other specified health status: Secondary | ICD-10-CM

## 2019-11-11 DIAGNOSIS — O360910 Maternal care for other rhesus isoimmunization, first trimester, not applicable or unspecified: Secondary | ICD-10-CM

## 2019-11-11 DIAGNOSIS — Z3400 Encounter for supervision of normal first pregnancy, unspecified trimester: Secondary | ICD-10-CM

## 2019-11-11 DIAGNOSIS — B009 Herpesviral infection, unspecified: Secondary | ICD-10-CM

## 2019-11-11 DIAGNOSIS — O98511 Other viral diseases complicating pregnancy, first trimester: Secondary | ICD-10-CM

## 2019-11-11 DIAGNOSIS — Z6791 Unspecified blood type, Rh negative: Secondary | ICD-10-CM

## 2019-11-11 DIAGNOSIS — Z3A13 13 weeks gestation of pregnancy: Secondary | ICD-10-CM

## 2019-11-11 HISTORY — DX: Other specified health status: Z78.9

## 2019-11-11 NOTE — Patient Instructions (Signed)
AREA PEDIATRIC/FAMILY PRACTICE PHYSICIANS  [place] ([address]) . [facility] o Chambliss, MD; Eniola, MD; Hale, MD; Hensel, MD; McDiarmid, MD; McIntyer, MD; Jawaun Celmer, MD; Walden, MD o [address] [place], [place] [address] o [phone] o Mon-Fri 8:30-12:30, 1:30-5:00 o Providers come to see babies at [facility] o Accepting Medicaid . [facility] o Limited providers who accept newborns: Koirala, MD; Morrow, MD; Wolters, MD o [address], [place], [place] [address] o [phone] o Mon-Fri 8:00-5:30 o Babies seen by providers at [facility] o Does NOT accept Medicaid o Please call early in hospitalization for appointment (limited availability)  . [facility] o Mulberry, MD o [address] [place], [place] [address] o [phone] o Mon, Tue, Thur, Fri 8:30-5:00, Wed 10:00-7:00 (closed 1-2pm) o Babies seen by [facility] providers o Accepting Medicaid . [facility] o Rubin, MD o [address], [place], [place] [address] o [phone] o Mon-Fri 8:30-5:00, Sat 8:30-12:00 o Provider comes to see babies at [facility] o Accepting Medicaid o Must have been referred from current patients or contacted office prior to delivery . [facility] ([facility]) o Brown, MD; Chandler, MD; Ettefagh, MD; Grant, MD; Lester, MD; McCormick, MD; McQueen, MD; Prose, MD; Simha, MD; Stanley, MD; Stryffeler, NP; Tebben, NP o [address], [place], [place] [address] o [phone] o Mon, Tue, Thur, Fri 8:30-5:30, Wed 9:30-5:30, Sat 8:30-12:30 o Babies seen by [facility] providers o Accepting Medicaid o Only accepting infants of first-time parents or siblings of current patients o Hospital discharge coordinator will make follow-up appointment . Jack Amos o [address],  [place], [place]  [address] o [phone]   Fax - [phone] . [facility] o [address], [place], [place]  [address] o Phone - [phone]   Fax - [phone] . Shilpa Gosrani o [address], [place], [place]  [address] o [phone]  [place] ([address]) . [facility] o Bates, MD; [place], MD; Cooper, Cox, MD; MD; Davis, MD; Dovico, MD; Ettefaugh, MD; Little, MD; Lowe, MD; Keiffer, MD; Melvin, MD; Sumner, MD; Williams, MD o [address], [place], [place] [address] o [phone] o Mon-Fri 8:30-5:00 (extended evenings Mon-Thur as needed), Sat-Sun 10:00-1:00 o Providers come to see babies at [facility] o Accepting Medicaid for families of first-time babies and families with all children in the household age 3 and under. Must register with office prior to making appointment (M-F only). . [facility] o Henson, NP; Knapp, MD; Lalonde, MD; Tysinger, PA o [address] [place], [place] [address] o [phone] o Mon-Fri 8:00-5:00 o Babies seen by providers at [facility] o Does NOT accept Medicaid/Commercial Insurance Only . [facility] ([facility])  o Artis, MD; Barnes, MD; Bratton, MD; Coccaro, MD; Lockett Gardner, MD; Kramer, MD; Marshall, MD; Netherton, MD; Poleto, MD; Skinner, MD o [address] [place], [place] [address] o [phone] o Mon-Fri 8:30-5:30, Sat (Oct.-Mar.) 9:00-1:00 o Babies seen by providers at [facility] o Accepting Medicaid  [place] ([address]) . [facility] o Reid, MD; Warner, MD o [address], , [place] [address] o [phone] o Mon-Fri 8:30-5:00, Sat 8:30-12:00 o Providers come to see babies at [facility] o Does NOT accept Medicaid . [facility] o Becker, PA; Hagler, MD; Scifres, PA; Sun, MD; Swayne, MD o [address],  [place], [place] [address] o [phone] o Mon-Fri 8:00-5:00 o Babies seen by providers at [facility] o Does NOT accept Medicaid o Only accepting babies of parents who are patients o Please call early in hospitalization for appointment (limited availability) . [facility] o Clark, MD; Frye, MD; Kelleher, MD; Mack, NP; Miller, MD; O'Keller, MD; Patterson, NP; Pudlo, MD; Puzio, MD; Thomas, MD; Tucker, MD; Twiselton, MD o [address], [place], [place] [address] o [phone] o Mon-Fri 8:00-5:00, Sat 9:00-12:00 o Providers come to see babies at [facility] o Does NOT accept Medicaid  [place] ([address]) . [facility] o Limited providers accepting new patients: Brake, NP; [place], PA o [address], [place], [place] [address] o [phone] o Mon-Fri 8:00-5:00 o Babies seen by providers at [facility] o Does NOT accept Medicaid o Only accepting babies of parents who are patients o Please call early in hospitalization for appointment (limited availability) . [facility] o Gay, MD; Quinlan, MD o [address] [place], [place] [address] o [phone] (press [phone] to schedule appointment) o Mon-Fri 8:00-5:00 o Providers come to see babies at [facility] o Does NOT accept Medicaid . [facility] o Mazer, MD o [address] [place], [place] [address] o [phone] o Mon-Fri 8:30-5:00 (lunch 12:30-1:00), extended hours by appointment only Wed 5:00-6:30 o Babies seen by [facility] providers o Accepting Medicaid . [facility] o Banks, MD; Jordan, MD; Koberlein, MD o [address], [place], [place] [address] o [phone] o Mon-Fri 8:00-5:00 o Babies seen by [facility] providers o Does NOT accept Medicaid . [facility] o Parker, MD; Hunter, MD; Wallace, DO o [address] [place], [place]  [address] o [phone] o Mon-Fri 8:00-5:00 o Babies seen by [facility] providers o Does NOT accept Medicaid . [facility] o Brandon, PA; Brecken, PA; Christy, NP; Dees, MD; DeClaire, MD; DeWeese, MD; Hansen, NP; Mills, NP; Parrish, NP; Smoot, NP; Summer, MD; Vapne, MD o [address] [place], [place] [address] o [phone] o Mon-Fri 8:30-5:00, Sat 10:00-1:00 o Providers come to see babies at [facility] o Does NOT accept Medicaid o Free prenatal information session Tuesdays at 4:45pm . [facility] o Bouska, MD; Gordon, PA; Jeffery, PA; Weber, PA o [address] [place] [address] o [phone] o Mon-Fri 7:30-5:30 o Babies seen by [facility] providers . [facility] o [address], [place], [place]  [address] o [phone]   Fax - [phone]  [place] ([address]) . [facility] o Reese, MD o [address] [place], [place] [address] o [phone] o Mon-Thur 8:00-6:00 o Providers come to see babies at [facility] o Accepting Medicaid . [facility] o Anderson, NP; Badger, MD; Beal, PA; Spencer, PA o [address] [place], [place] [address] o [phone] o Mon-Thur 7:30-7:30, Fri 7:30-4:30 o Babies seen by [facility] providers o Accepting Medicaid . [facility] o Agbuya, MD; Klett, NP; Romgoolam, MD o [address], [place],  [address] o [phone] o Mon-Fri 8:30-5:00, Sat 8:30-12:00 o Providers come to see babies at [facility] o Accepting Medicaid o Must have "Meet & Greet" appointment at office prior to delivery . [facility] ([facility] o McCord,   MD; Wallace, MD; Wood, MD o [address], [place], [place] [address] o [phone] o Mon-Wed 8:00-6:00, Thur-Fri 8:00-5:00, Sat 9:00-12:00 o Providers come to  see babies at [facility] o Does NOT accept Medicaid o Only accepting siblings of current patients . [facility]  o [address], [place], [place]  [address] o [phone]   Fax - [phone] . [facility] o [address], [place], [place]  [address] o [phone]   Fax - [phone]  [place] ([address]) . [facility] o Cirigliano, DO; Matthews, DO o [address] [place], [place] [address] o [phone] o Mon-Fri 7:00-5:00 o Babies seen by [facility] providers o Does NOT accept Medicaid . [facility] o Briscoe, MD; Howley, PA; Moreira, PA o [address], [place], [place] [address] o [phone] o Mon-Fri 8:00-5:00 o Babies seen by [facility] providers o Accepting Medicaid . [facility] o Boyd, MD; Church, PA; Jones, NP; Osborn, PA o [address], [place], [place] [address] o [phone] o Mon-Fri 8:00-5:00 o Babies seen by providers at [facility] o Accepting Medicaid  [place] ([address]) . [facility] [place] o Wendling, DO o [address] [place], [place] [address] o [phone] o Mon-Fri 8:00-5:00 o Babies seen by [facility] providers o Does NOT accept Medicaid o Limited availability, please call early in hospitalization to schedule follow-up . [facility] o Calderon, PA; Cummings, MD; Dillard, MD; Martin, PA; Olson, MD; VanDeven, PA o [address], [place], [place] [address] o [phone] o Mon-Fri 8:30-5:00, Sat 9:00-12:00 o Babies seen by providers at [facility] o Accepting Medicaid o Please register online then schedule online or call office o [email or web address] . [facility] ([facility] o Hunter, NP; Kumar, MD; Martin Rogers, PA o [address], [place], [place] [address] o [phone] o Mon-Fri 8:00-5:00 o Babies seen by providers at [facility] o Accepting Medicaid . [facility] ([facility] o [place], MD; Kristi Fleenor, NP; West, MD o [address], [place], [place] [address] o [phone] o Mon-Fri 8:00-5:30, Sat&Sun by appointment (phones open at 8:30) o Babies seen by [facility] providers o Accepting Medicaid o Must be a first-time baby or sibling of current patient . [facility] [place]  o [address], [place], [place]  [address] o [phone]   Fax - [phone]  [place] ([address]) . [facility] o Brown, PA; Cowen, PA; Rice, MD; Helton, PA; Spry, MD o [address] [place], [place] [address] o [phone] o Mon-Thur 8:00-7:00, Fri 8:00-5:00, Sat 8:00-12:00, Sun 9:00-12:00 o Babies seen by [facility] providers o Accepting Medicaid . [facility] o Coe-Goins, MD; Marshall, MD; Pierre-Louis, MD o [address] [place] [address], [place], [place] [address] o [phone] o Mon-Thur 8:00-5:00 o Babies seen by providers at [facility] o Accepting Medicaid . [facility] o Bratton, MD; Coe-Goins, MD; Hayes, MD; Lewis, MD; List, MD; Lott, MD; Marshall, MD; Moran, MD; O'Zaiyden Strozier, MD; Pierre-Louis, MD; Pitonzo, MD; Scholer, MD; Spangle, MD o [address] [place], [place]   [address] o [phone] o Mon-Fri 8:00-5:30, Sat (Oct.-Mar.) 9:00-1:00 o Babies seen by providers at [facility] o Accepting Medicaid o Must fill out new patient packet, available online at [email or web address] . [facility] ([facility]) o Friddle, NP; Harris, NP; Kelly, NP; Logan, MD;  Melvin, PA; Poth, MD; Ramadoss, MD; Stanton, NP o [address], [place], [place] [address] o [phone] o Mon-Thur 8:00-5:30, Fri 8:00-5:00 o Babies seen by providers at [facility] o Accepting Medicaid  [place] ([address]) . [facility] o Dixon, PA; Hanna, MD; Pickard, MD; Tapia, PA o [address], [place], [place] [address] o [phone] o Mon-Fri 8:00-5:00 o Babies seen by providers at [facility] o Accepting Medicaid   [place] ([address]) . [facility] [place] o Masneri, DO; Meyers, MD; Nelson, PA o [address], [place], [place] [address] o [phone] o Mon-Fri 8:00-5:00 o Babies seen by providers at [facility] o Does NOT accept Medicaid o Limited appointment availability, please call early in hospitalization  . [facility] [place] o Kunedd, DO; McGowen, MD o [address], [place], [place] [address] o [phone] o Mon-Fri 8:00-5:00 o Babies seen by [facility] providers o Does NOT accept Medicaid . [facility] [place] o Cameron, MD; MacDonald, MD; Michaels, PA; Nayak, MD o [address] [place] [address], [place], [place] [address] o [phone] o Mon-Fri 8:00-5:00 o After hours clinic ([address] [place], [place] [address]) [phone] Mon-Fri 5:00-8:00, Sat 12:00-6:00, Sun 10:00-4:00 o Babies seen by [facility] providers o Accepting Medicaid . [facility] [place] o [address], [place], [place]  [address] o [phone]   Fax - [phone]  [place] ([address]) . [facility] [place] [facility] o Andy, MD o [address], [place], [place] [address] o [phone] o Mon-Fri 8:00-5:00 o Babies seen by [facility] providers o Does NOT accept Medicaid . [facility] [place] ([facility] [place]) o Eksir, MD o [address], [place], [place]  [address] o [phone] o Mon-Thur 8:00-7:00, Fri 8:00-5:00, Sat 8:00-12:00 o Babies seen by providers at [facility] o Accepting Medicaid - but does not have vaccinations in office (must be received elsewhere) o Limited availability, please call early in hospitalization  [place] ([address]) . [facility]  o Charlene Flemming, MD o [address], [place] [address] o [phone]  Fax [phone]   

## 2019-11-11 NOTE — [place] Notes (Signed)
   PRENATAL VISIT NOTE  Subjective:  Ashley Greene is a 29 y.o. G1P0 at [redacted]w[redacted]d being seen today for ongoing prenatal care.  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  Patient reports no complaints.  Contractions: Not present. Vag. Bleeding: None.  Movement: Absent. Denies leaking of fluid.   The following portions of the patient's history were reviewed and updated as appropriate: allergies, current medications, past family history, past medical history, past social history, past surgical history and problem list. Problem list updated.  Objective:   Vitals:   11/11/19 0852  BP: 103/69  Pulse: 93  Weight: 115 lb 9.6 oz (52.4 kg)    Fetal Status: Fetal Heart Rate (bpm): 155   Movement: Absent     General:  Alert, oriented and cooperative. Patient is in no acute distress.  Skin: Skin is warm and dry. No rash noted.   Cardiovascular: Normal heart rate noted  Respiratory: Normal respiratory effort, no problems with respiration noted  Abdomen: Soft, gravid, appropriate for gestational age.  Pain/Pressure: Absent     Pelvic: Cervical exam deferred        Extremities: Normal range of motion.  Edema: None  Mental Status: Normal mood and affect. Normal behavior. Normal judgment and thought content.   Assessment and Plan:  Pregnancy: G1P0 at [redacted]w[redacted]d  1. Supervision of normal first pregnancy, antepartum --S/p New OB with [facility]. New OB labs not showing in EMR.  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 Please refer to After Visit Summary for other counseling recommendations.  Return in about 4 weeks (around 12/09/2019).  Future Appointments  Date Time Provider Department Center  12/10/2019  9:15 AM Marylen Ponto, NP [facility]  12/24/2019  8:45 AM [facility] NURSE [facility]  12/24/2019  8:45 AM [facility] US5 [facility]    Calvert Cantor, CNM

## 2019-11-12 LAB — OBSTETRIC PANEL, INCLUDING HIV
Antibody Screen: NEGATIVE
Basophils Absolute: 0 10*3/uL (ref 0.0–0.2)
Basos: 0 %
EOS (ABSOLUTE): 0 10*3/uL (ref 0.0–0.4)
Eos: 0 %
HIV Screen 4th Generation wRfx: NONREACTIVE
Hematocrit: 33.8 % — ABNORMAL LOW (ref 34.0–46.6)
Hemoglobin: 11.7 g/dL (ref 11.1–15.9)
Hepatitis B Surface Ag: NEGATIVE
Immature Grans (Abs): 0 10*3/uL (ref 0.0–0.1)
Immature Granulocytes: 0 %
Lymphocytes Absolute: 2 10*3/uL (ref 0.7–3.1)
Lymphs: 17 %
MCH: 32.1 pg (ref 26.6–33.0)
MCHC: 34.6 g/dL (ref 31.5–35.7)
MCV: 93 fL (ref 79–97)
Monocytes Absolute: 0.7 10*3/uL (ref 0.1–0.9)
Monocytes: 6 %
Neutrophils Absolute: 8.8 10*3/uL — ABNORMAL HIGH (ref 1.4–7.0)
Neutrophils: 77 %
Platelets: 308 10*3/uL (ref 150–450)
RBC: 3.64 x10E6/uL — ABNORMAL LOW (ref 3.77–5.28)
RDW: 11.8 % (ref 11.7–15.4)
RPR Ser Ql: NONREACTIVE
Rh Factor: NEGATIVE
Rubella Antibodies, IGG: 7.06 index (ref >0.99)
WBC: 11.6 10*3/uL — ABNORMAL HIGH (ref 3.4–10.8)

## 2019-11-12 LAB — SMN1 COPY NUMBER ANALYSIS (SMA CARRIER SCREENING)

## 2019-11-12 LAB — CYSTIC FIBROSIS GENE TEST

## 2019-11-20 ENCOUNTER — Telehealth: Payer: Self-pay | Admitting: Family Medicine

## 2019-11-20 NOTE — Telephone Encounter (Signed)
Results of genetic screen (Panorama) not found and per chart review, order for the test is also not found. Pt had started prenatal care @ [facility]. Message sent to Armandina Stammer, RN in attempt to see if she has information.

## 2019-11-20 NOTE — Telephone Encounter (Signed)
Patient want to know if she need more lab test done

## 2019-11-20 NOTE — Telephone Encounter (Signed)
Patient called in stating that she would like to know the results of her genetic testing results as well as the gender of her baby. Patient instructed that a message will be sent to the nurses and they will contact her as soon as they can. Patient verbalized understanding and message sent to clinical pool.

## 2019-11-20 NOTE — Telephone Encounter (Signed)
Pt's concerns will be addressed in another encounter.

## 2019-11-22 ENCOUNTER — Telehealth: Payer: Self-pay | Admitting: Family Medicine

## 2019-11-22 DIAGNOSIS — L42 Pityriasis rosea: Secondary | ICD-10-CM | POA: Diagnosis not present

## 2019-11-22 DIAGNOSIS — L309 Dermatitis, unspecified: Secondary | ICD-10-CM | POA: Diagnosis not present

## 2019-11-22 NOTE — Telephone Encounter (Signed)
Patient called in stating that she needs to speak to a nurse about the confusion on whether or not she has had the genetic testing lab work done. Patient stated that some of the samples were sent but not all of them. Patient also stated that she has left several voicemail and was wondering what the turn around time for that is. Patient instructed that the nurse will contact her as soon as they can. Patient verbalized understanding. Message sent to clinical pool.

## 2019-11-22 NOTE — Telephone Encounter (Signed)
Attempted to call patient and was not able to reach her. Message left for patient to call the office at her convenience.

## 2019-11-25 ENCOUNTER — Telehealth: Payer: Self-pay

## 2019-11-25 NOTE — Telephone Encounter (Addendum)
Pt call transferred from the front office.  Pt asked about her genetic screening results.  She stated that she was a transfer from the [facility] office and was told that a genetic screening was drawn.  I explained to the pt that per chart review I do see that she had some labs drawn however she did not get a Panorama drawn.  I explained to the pt that she is to get that drawn tomorrow in which it will take approximately two weeks for results.  Pt verbalized understanding.   Addison Naegeli, RN

## 2019-11-26 ENCOUNTER — Other Ambulatory Visit: Payer: Self-pay

## 2019-11-26 ENCOUNTER — Other Ambulatory Visit: Payer: BC Managed Care – PPO

## 2019-11-28 ENCOUNTER — Telehealth: Payer: Self-pay

## 2019-11-28 NOTE — Telephone Encounter (Signed)
Patient called our office because she states she is going to [facility] and has been unable to reach them by phone.   Patient states she was bending over this morning and had abdominal pain but has since stopped. Patient is [redacted] weeks pregnant. Patient denies any bleeding or leaking of fluid. Patient encourage that since it has been relived today it is most likely round ligament pain. Patient instructed she could try a pregnancy support belt. Patient asked if there are any exercise she could do- explained she could try prenatal yoga. Armandina Stammer RN

## 2019-12-02 ENCOUNTER — Telehealth: Payer: Self-pay | Admitting: Lactation Services

## 2019-12-02 NOTE — Telephone Encounter (Signed)
Returned patients call. Patient did not answer. LM for patient to call the office at her convenience.   Will send My Chart Message.

## 2019-12-02 NOTE — Telephone Encounter (Signed)
Patient called and left message on nurse voicemail. She reports she has questions about her next appointment and feels she may have a yeast infection.

## 2019-12-03 ENCOUNTER — Other Ambulatory Visit: Payer: Self-pay | Admitting: Lactation Services

## 2019-12-03 MED ORDER — TERCONAZOLE 0.4 % VA CREA
1.0000 | TOPICAL_CREAM | Freq: Every day | VAGINAL | 0 refills | Status: DC
Start: 2019-12-03 — End: 2020-03-17

## 2019-12-09 NOTE — [place] Notes (Signed)
Subjective:  Ashley Greene is a 29 y.o. G1P0 at [redacted]w[redacted]d being seen today for ongoing prenatal care.  She is currently monitored for the following issues for this low-risk pregnancy and has Supervision of normal first pregnancy, antepartum; HSV-2 infection; Rh negative state in antepartum period; and Rubella immune on their problem list.  Patient reports patient reports thick, white discharge that is mostly creamy, white with some clumps of mucus that started about 1 week ago. Patient also reports vaginal dryness. Patient denies itching, and reports a slight "bread odor.".  Contractions: Not present. Vag. Bleeding: None.  Movement: Present. Denies leaking of fluid.   The following portions of the patient's history were reviewed and updated as appropriate: allergies, current medications, past family history, past medical history, past social history, past surgical history and problem list. Problem list updated.  Objective:   Vitals:   12/10/19 1041  BP: 100/64  Pulse: 78  Weight: 116 lb (52.6 kg)    Fetal Status: Fetal Heart Rate (bpm): 142   Movement: Present     General:  Alert, oriented and cooperative. Patient is in no acute distress.  Skin: Skin is warm and dry. No rash noted.   Cardiovascular: Normal heart rate noted  Respiratory: Normal respiratory effort, no problems with respiration noted  Abdomen: Soft, gravid, appropriate for gestational age. Pain/Pressure: Absent     Pelvic: Vag. Bleeding: None     Cervical exam deferred        Extremities: Normal range of motion.  Edema: None  Mental Status: Normal mood and affect. Normal behavior. Normal judgment and thought content.   Urinalysis:      Assessment and Plan:  Pregnancy: G1P0 at [redacted]w[redacted]d  1. Supervision of normal first pregnancy, antepartum -discussed that vaginal discharge does not sound concerning for BV or yeast and sounds like normal discharge; advised patient if positive for yeast without symptoms does not need treatment  and if positive for BV on swab would not treat as would not meet criteria -discussed genetic testing previously performed - only SMA/CF performed at [facility] -patient declines Horizon genetic testing today, will consider Panorama for fetal genetics but does not want to have it performed today after thorough discussion of differences in testing and risks/benefits; patient informed that genetic testing is optional -pt states is not concerned about insurance pricing for genetic testing -anatomy US scheduled 12/24/2019, pt aware -pt would like placed in her record that she used topical betamethasone for one week as per dermatology for 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 Rh negative state in antepartum period -O NEGATIVE, discussed will need RhoGAM at 28wk visit, advised to let clinic know ASAP about any vaginal bleeding sooner  Preterm labor symptoms and general obstetric precautions including but not limited to vaginal bleeding, contractions,  leaking of fluid and fetal movement were reviewed in detail with the patient. Please refer to After Visit Summary for other counseling recommendations.  Return in about 4 weeks (around 01/07/2020) for  in-person LOB/APP OK, needs lab only visit later this week for AFP only.   Khaleel Beckom, Odie Sera, NP

## 2019-12-10 ENCOUNTER — Other Ambulatory Visit: Payer: Self-pay

## 2019-12-10 ENCOUNTER — Encounter: Payer: Self-pay | Admitting: Women's Health

## 2019-12-10 ENCOUNTER — Other Ambulatory Visit (HOSPITAL_COMMUNITY)
Admission: RE | Admit: 2019-12-10 | Discharge: 2019-12-10 | Disposition: A | Discharge status: Home / Self Care | Payer: BC Managed Care – PPO | Source: Ambulatory Visit | Attending: Women's Health | Admitting: Women's Health

## 2019-12-10 ENCOUNTER — Ambulatory Visit (INDEPENDENT_AMBULATORY_CARE_PROVIDER_SITE_OTHER): Payer: BC Managed Care – PPO | Admitting: Women's Health

## 2019-12-10 VITALS — BP 100/64 | HR 78 | Wt 116.0 lb

## 2019-12-10 DIAGNOSIS — Z3400 Encounter for supervision of normal first pregnancy, unspecified trimester: Secondary | ICD-10-CM

## 2019-12-10 DIAGNOSIS — O26892 Other specified pregnancy related conditions, second trimester: Secondary | ICD-10-CM | POA: Insufficient documentation

## 2019-12-10 DIAGNOSIS — N898 Other specified noninflammatory disorders of vagina: Secondary | ICD-10-CM | POA: Insufficient documentation

## 2019-12-10 DIAGNOSIS — O98512 Other viral diseases complicating pregnancy, second trimester: Secondary | ICD-10-CM

## 2019-12-10 DIAGNOSIS — O98312 Other infections with a predominantly sexual mode of transmission complicating pregnancy, second trimester: Secondary | ICD-10-CM | POA: Diagnosis not present

## 2019-12-10 DIAGNOSIS — B009 Herpesviral infection, unspecified: Secondary | ICD-10-CM

## 2019-12-10 DIAGNOSIS — O360120 Maternal care for anti-D [Rh] antibodies, second trimester, not applicable or unspecified: Secondary | ICD-10-CM

## 2019-12-10 DIAGNOSIS — Z6791 Unspecified blood type, Rh negative: Secondary | ICD-10-CM

## 2019-12-10 DIAGNOSIS — Z3A17 17 weeks gestation of pregnancy: Secondary | ICD-10-CM | POA: Insufficient documentation

## 2019-12-10 DIAGNOSIS — O26899 Other specified pregnancy related conditions, unspecified trimester: Secondary | ICD-10-CM

## 2019-12-10 NOTE — Patient Instructions (Addendum)
Maternity Assessment Unit (MAU)  The Maternity Assessment Unit (MAU) is located at the [facility]. The address is: [address], [address], [place], [place] [address]. Please see map below for additional directions.    The Maternity Assessment Unit is designed to help you during your pregnancy, and for up to 6 weeks after delivery, with any pregnancy- or postpartum-related emergencies, if you think you are in labor, or if your water has broken. For example, if you experience nausea and vomiting, vaginal bleeding, severe abdominal or pelvic pain, elevated blood pressure or other problems related to your pregnancy or postpartum time, please come to the Maternity Assessment Unit for assistance.        Preterm Labor and Birth Information  The normal length of a pregnancy is 39-41 weeks. Preterm labor is when labor starts before 37 completed weeks of pregnancy. What are the risk factors for preterm labor? Preterm labor is more likely to occur in women who:  Have certain infections during pregnancy such as a bladder infection, sexually transmitted infection, or infection inside the uterus (chorioamnionitis).  Have a shorter-than-normal cervix.  Have gone into preterm labor before.  Have had surgery on their cervix.  Are younger than age 17 or older than age 35.  Are African American.  Are pregnant with twins or multiple babies (multiple gestation).  Take street drugs or smoke while pregnant.  Do not gain enough weight while pregnant.  Became pregnant shortly after having been pregnant. What are the symptoms of preterm labor? Symptoms of preterm labor include:  Cramps similar to those that can happen during a menstrual period. The cramps may happen with diarrhea.  Pain in the abdomen or lower back.  Regular uterine contractions that may feel like tightening of the abdomen.  A feeling of increased pressure in the  pelvis.  Increased watery or bloody mucus discharge from the vagina.  Water breaking (ruptured amniotic sac). Why is it important to recognize signs of preterm labor? It is important to recognize signs of preterm labor because babies who are born prematurely may not be fully developed. This can put them at an increased risk for:  Long-term (chronic) heart and lung problems.  Difficulty immediately after birth with regulating body systems, including blood sugar, body temperature, heart rate, and breathing rate.  Bleeding in the brain.  Cerebral palsy.  Learning difficulties.  Death. These risks are highest for babies who are born before 34 weeks of pregnancy. How is preterm labor treated? Treatment depends on the length of your pregnancy, your condition, and the health of your baby. It may involve:  Having a stitch (suture) placed in your cervix to prevent your cervix from opening too early (cerclage).  Taking or being given medicines, such as: ? Hormone medicines. These may be given early in pregnancy to help support the pregnancy. ? Medicine to stop contractions. ? Medicines to help mature the baby's lungs. These may be prescribed if the risk of delivery is high. ? Medicines to prevent your baby from developing cerebral palsy. If the labor happens before 34 weeks of pregnancy, you may need to stay in the hospital. What should I do if I think I am in preterm labor? If you think that you are going into preterm labor, call your health care provider right away. How can I prevent preterm labor in future pregnancies? To increase your chance of having a full-term pregnancy:  Do not use any tobacco products, such as   chewing tobacco, and e-cigarettes. If you need help quitting, ask your health care provider.  Do not use street drugs or medicines that have not been prescribed to you during your pregnancy.  Talk with your health care provider before taking any herbal  supplements, even if you have been taking them regularly.  Make sure you gain a healthy amount of weight during your pregnancy.  Watch for infection. If you think that you might have an infection, get it checked right away.  Make sure to tell your health care provider if you have gone into preterm labor before. This information is not intended to replace advice given to you by your health care provider. Make sure you discuss any questions you have with your health care provider. Document Revised: 09/28/2018 Document Reviewed: 10/28/2015 Elsevier Patient Education  2020 [employer].         Alpha-Fetoprotein Test Why am I having this test? The alpha-fetoprotein test is most commonly used in pregnant women to help screen for birth defects in their unborn baby. It can be used to screen for birth defects, such as chromosome (DNA) abnormalities, problems with the brain or spinal cord, or problems with the abdominal wall of the unborn baby (fetus). The alpha-fetoprotein test may also be done for men or non-pregnant women to check for certain cancers. What is being tested? This test measures the amount of alpha-fetoprotein (AFP) in your blood. AFP is a protein that is made by the liver. Levels can be detected in the mother's blood during pregnancy, starting at 10 weeks and peaking at 16-18 weeks of the pregnancy. Abnormal levels can sometimes be a sign of a birth defect in the baby. Certain cancers can cause a high level of AFP in men and non-pregnant women. What kind of sample is taken?  A blood sample is required for this test. It is usually collected by inserting a needle into a blood vessel. How are the results reported? Your test results will be reported as values. Your health care provider will compare your results to normal ranges that were established after testing a large group of people (reference values). Reference values may vary among labs and hospitals. For this test, common  reference values are:  Adult: Less than 40 ng/mL or less than 40 mcg/L (SI units).  Child younger than 1 year: Less than 30 ng/mL. If you are pregnant, the values may also vary based on how long you have been pregnant. What do the results mean? Results that are above the reference values in pregnant women may indicate the following for the baby:  Neural tube defects, such as abnormalities of the spinal cord or brain.  Abdominal wall defects.  Multiple pregnancy such as twins.  Fetal distress or fetal death. Results that are above the reference values in men or non-pregnant women may indicate:  Reproductive cancers, such as ovarian or testicular cancer.  Liver cancer.  Liver cell death.  Other types of cancer. Very low levels of AFP in pregnant women may indicate the following for the baby:  Down syndrome.  Fetal death. Talk with your health care provider about what your results mean. Questions to ask your health care provider Ask your health care provider, or the department that is doing the test:  When will my results be ready?  How will I get my results?  What are my treatment options?  What other tests do I need?  What are my next steps? Summary  The alpha-fetoprotein test is done  on pregnant women to help screen for birth defects in their unborn baby.  Certain cancers can cause a high level of AFP in men and non-pregnant women.  For this test, a blood sample is usually collected by inserting a needle into a blood vessel.  Talk with your health care provider about what your results mean. This information is not intended to replace advice given to you by your health care provider. Make sure you discuss any questions you have with your health care provider. Document Revised: 05/19/2017 Document Reviewed: 01/10/2017 Elsevier Patient Education  2020 [employer] Inc.          Round Ligament Pain  The round ligament is a cord of muscle and tissue that  helps support the uterus. It can become a source of pain during pregnancy if it becomes stretched or twisted as the baby grows. The pain usually begins in the second trimester (13-28 weeks) of pregnancy, and it can come and go until the baby is delivered. It is not a serious problem, and it does not cause harm to the baby. Round ligament pain is usually a short, sharp, and pinching pain, but it can also be a dull, lingering, and aching pain. The pain is felt in the lower side of the abdomen or in the groin. It usually starts deep in the groin and moves up to the outside of the hip area. The pain may occur when you: Suddenly change position, such as quickly going from a sitting to standing position. Roll over in bed. Cough or sneeze. Do physical activity. Follow these instructions at home:  Watch your condition for any changes. When the pain starts, relax. Then try any of these methods to help with the pain: Sitting down. Flexing your knees up to your abdomen. Lying on your side with one pillow under your abdomen and another pillow between your legs. Sitting in a warm bath for 15-20 minutes or until the pain goes away. Take over-the-counter and prescription medicines only as told by your health care provider. Move slowly when you sit down or stand up. Avoid long walks if they cause pain. Stop or reduce your physical activities if they cause pain. Keep all follow-up visits as told by your health care provider. This is important. Contact a health care provider if: Your pain does not go away with treatment. You feel pain in your back that you did not have before. Your medicine is not helping. Get help right away if: You have a fever or chills. You develop uterine contractions. You have vaginal bleeding. You have nausea or vomiting. You have diarrhea. You have pain when you urinate. Summary Round ligament pain is felt in the lower abdomen or groin. It is usually a short, sharp, and pinching  pain. It can also be a dull, lingering, and aching pain. This pain usually begins in the second trimester (13-28 weeks). It occurs because the uterus is stretching with the growing baby, and it is not harmful to the baby. You may notice the pain when you suddenly change position, when you cough or sneeze, or during physical activity. Relaxing, flexing your knees to your abdomen, lying on one side, or taking a warm bath may help to get rid of the pain. Get help from your health care provider if the pain does not go away or if you have vaginal bleeding, nausea, vomiting, diarrhea, or painful urination. This information is not intended to replace advice given to you by your health care provider. Make  sure you discuss any questions you have with your health care provider. Document Revised: 11/22/2017 Document Reviewed: 11/22/2017 Elsevier Patient Education  2020 [employer].            Second Trimester of Pregnancy The second trimester is from week 14 through week 27 (months 4 through 6). The second trimester is often a time when you feel your best. Your body has adjusted to being pregnant, and you begin to feel better physically. Usually, morning sickness has lessened or quit completely, you may have more energy, and you may have an increase in appetite. The second trimester is also a time when the fetus is growing rapidly. At the end of the sixth month, the fetus is about 9 inches long and weighs about 1 pounds. You will likely begin to feel the baby move (quickening) between 16 and 20 weeks of pregnancy. Body changes during your second trimester Your body continues to go through many changes during your second trimester. The changes vary from woman to woman.  Your weight will continue to increase. You will notice your lower abdomen bulging out.  You may begin to get stretch marks on your hips, abdomen, and breasts.  You may develop headaches that can be relieved by medicines. The  medicines should be approved by your health care provider.  You may urinate more often because the fetus is pressing on your bladder.  You may develop or continue to have heartburn as a result of your pregnancy.  You may develop constipation because certain hormones are causing the muscles that push waste through your intestines to slow down.  You may develop hemorrhoids or swollen, bulging veins (varicose veins).  You may have back pain. This is caused by: ? Weight gain. ? Pregnancy hormones that are relaxing the joints in your pelvis. ? A shift in weight and the muscles that support your balance.  Your breasts will continue to grow and they will continue to become tender.  Your gums may bleed and may be sensitive to brushing and flossing.  Dark spots or blotches (chloasma, mask of pregnancy) may develop on your face. This will likely fade after the baby is born.  A dark line from your belly button to the pubic area (linea nigra) may appear. This will likely fade after the baby is born.  You may have changes in your hair. These can include thickening of your hair, rapid growth, and changes in texture. Some women also have hair loss during or after pregnancy, or hair that feels dry or thin. Your hair will most likely return to normal after your baby is born. What to expect at prenatal visits During a routine prenatal visit:  You will be weighed to make sure you and the fetus are growing normally.  Your blood pressure will be taken.  Your abdomen will be measured to track your baby's growth.  The fetal heartbeat will be listened to.  Any test results from the previous visit will be discussed. Your health care provider may ask you:  How you are feeling.  If you are feeling the baby move.  If you have had any abnormal symptoms, such as leaking fluid, bleeding, severe headaches, or abdominal cramping.  If you are using any tobacco products, including cigarettes, chewing  tobacco, and electronic cigarettes.  If you have any questions. Other tests that may be performed during your second trimester include:  Blood tests that check for: ? Low iron levels (anemia). ? High blood sugar that affects  pregnant women (gestational diabetes) between 1424 and 28 weeks. ? Rh antibodies. This is to check for a protein on red blood cells (Rh factor).  Urine tests to check for infections, diabetes, or protein in the urine.  An ultrasound to confirm the proper growth and development of the baby.  An amniocentesis to check for possible genetic problems.  Fetal screens for spina bifida and Down syndrome.  HIV (human immunodeficiency virus) testing. Routine prenatal testing includes screening for HIV, unless you choose not to have this test. Follow these instructions at home: Medicines  Follow your health care provider's instructions regarding medicine use. Specific medicines may be either safe or unsafe to take during pregnancy.  Take a prenatal vitamin that contains at least 600 micrograms (mcg) of folic acid.  If you develop constipation, try taking a stool softener if your health care provider approves. Eating and drinking   Eat a balanced diet that includes fresh fruits and vegetables, whole grains, good sources of protein such as meat, eggs, or tofu, and low-fat dairy. Your health care provider will help you determine the amount of weight gain that is right for you.  Avoid raw meat and uncooked cheese. These carry germs that can cause birth defects in the baby.  If you have low calcium intake from food, talk to your health care provider about whether you should take a daily calcium supplement.  Limit foods that are high in fat and processed sugars, such as fried and sweet foods.  To prevent constipation: ? Drink enough fluid to keep your urine clear or pale yellow. ? Eat foods that are high in fiber, such as fresh fruits and vegetables, whole grains, and  beans. Activity  Exercise only as directed by your health care provider. Most women can continue their usual exercise routine during pregnancy. Try to exercise for 30 minutes at least 5 days a week. Stop exercising if you experience uterine contractions.  Avoid heavy lifting, wear low heel shoes, and practice good posture.  A sexual relationship may be continued unless your health care provider directs you otherwise. Relieving pain and discomfort  Wear a good support bra to prevent discomfort from breast tenderness.  Take warm sitz baths to soothe any pain or discomfort caused by hemorrhoids. Use hemorrhoid cream if your health care provider approves.  Rest with your legs elevated if you have leg cramps or low back pain.  If you develop varicose veins, wear support hose. Elevate your feet for 15 minutes, 3-4 times a day. Limit salt in your diet. Prenatal Care  Write down your questions. Take them to your prenatal visits.  Keep all your prenatal visits as told by your health care provider. This is important. Safety  Wear your seat belt at all times when driving.  Make a list of emergency phone numbers, including numbers for family, friends, the hospital, and police and fire departments. General instructions  Ask your health care provider for a referral to a local prenatal education class. Begin classes no later than the beginning of month 6 of your pregnancy.  Ask for help if you have counseling or nutritional needs during pregnancy. Your health care provider can offer advice or refer you to specialists for help with various needs.  Do not use hot tubs, steam rooms, or saunas.  Do not douche or use tampons or scented sanitary pads.  Do not cross your legs for long periods of time.  Avoid cat litter boxes and soil used by cats. These carry  germs that can cause birth defects in the baby and possibly loss of the fetus by miscarriage or stillbirth.  Avoid all smoking, herbs,  alcohol, and unprescribed drugs. Chemicals in these products can affect the formation and growth of the baby.  Do not use any products that contain nicotine or tobacco, such as cigarettes and e-cigarettes. If you need help quitting, ask your health care provider.  Visit your dentist if you have not gone yet during your pregnancy. Use a soft toothbrush to brush your teeth and be gentle when you floss. Contact a health care provider if:  You have dizziness.  You have mild pelvic cramps, pelvic pressure, or nagging pain in the abdominal area.  You have persistent nausea, vomiting, or diarrhea.  You have a bad smelling vaginal discharge.  You have pain when you urinate. Get help right away if:  You have a fever.  You are leaking fluid from your vagina.  You have spotting or bleeding from your vagina.  You have severe abdominal cramping or pain.  You have rapid weight gain or weight loss.  You have shortness of breath with chest pain.  You notice sudden or extreme swelling of your face, hands, ankles, feet, or legs.  You have not felt your baby move in over an hour.  You have severe headaches that do not go away when you take medicine.  You have vision changes. Summary  The second trimester is from week 14 through week 27 (months 4 through 6). It is also a time when the fetus is growing rapidly.  Your body goes through many changes during pregnancy. The changes vary from woman to woman.  Avoid all smoking, herbs, alcohol, and unprescribed drugs. These chemicals affect the formation and growth your baby.  Do not use any tobacco products, such as cigarettes, chewing tobacco, and e-cigarettes. If you need help quitting, ask your health care provider.  Contact your health care provider if you have any questions. Keep all prenatal visits as told by your health care provider. This is important. This information is not intended to replace advice given to you by your health care  provider. Make sure you discuss any questions you have with your health care provider. Document Revised: 09/28/2018 Document Reviewed: 07/12/2016 Elsevier Patient Education  2020 [employer] Inc[employer]        Rh Incompatibility Rh incompatibility is a condition that occurs during pregnancy if a woman has Rh-negative blood and her baby has Rh-positive blood. "Rh-negative" and "Rh-positive" refer to whether or not the blood has a specific protein found on the surface of red blood cells (Rh factor). If a woman has Rh factor, she is Rh-positive. If she does not have an Rh factor, she is Rh-negative. Having or not having an Rh factor does not affect the mother's general health. However, it can cause problems during pregnancy. What kind of problems can Rh incompatibility cause? During pregnancy, blood from the baby can cross into the mother's bloodstream, especially during delivery. If a mother is Rh-negative and the baby is Rh-positive, the mother's defense (immune) system will react to the baby's blood as if it were a foreign substance and will create certain proteins (antibodies) in response to it. This process is called sensitization. Once the mother is sensitized, her Rh antibodies will cross the placenta in future pregnancies and attack the baby's Rh-positive blood as if it were a harmful substance. Rh incompatibility can also happen if a pregnant woman who is Rh-negative receives a donation (transfusion)  of Rh-positive blood. How does this condition affect my baby? The Rh antibodies that attack and destroy your baby's red blood cells can lead to hemolytic disease in the baby. This is a condition in which red blood cells break down. This can cause:  Yellowing of the skin and the whites of the eyes (jaundice).  Fewer red blood cells in the body (anemia).  Brain damage.  Heart failure.  Death. These antibodies usually do not cause problems during a woman's first pregnancy. This is because blood from  the baby often crosses into the mother's bloodstream during delivery, and the baby is born before many of the antibodies can develop. However, once antibodies have formed, they stay in a woman's body. Because of this, Rh incompatibility is more likely to cause problems in second or later pregnancies (if the baby is Rh-positive). How is this diagnosed?  When you become pregnant, you may have blood tests to determine your blood type and Rh factor. If you are Rh-negative, you may have another blood test called an antibody screen. The antibody screen shows whether you have Rh antibodies in your blood. If you do, it may mean that you have been exposed to Rh-positive blood before, and that you have a risk for Rh incompatibility. To find out whether your baby is developing hemolytic anemia and how serious it is, health care providers may use more advanced tests, such as ultrasound. How is Rh incompatibility treated? This condition is treated with two shots (injections) of medicine called Rho (D) immune globulin. This medicine keeps your body from making antibodies that can cause serious problems for your baby or for future babies. You will get one shot around your seventh month of pregnancy and the other within 72 hours of your baby's birth. If you are Rh-negative, you will need this medicine every time you have a baby with Rh-positive blood. If you are Rh-negative and there is a high risk of blood transfer between you and your baby, you may be given Rho (D) immune globulin. The risk of blood transfer is high if you experience:  Amniocentesis. This is a procedure to remove a small amount of the fluid that surrounds a baby in the uterus (amniotic fluid) so that it can be tested.  A miscarriage or an abortion.  An ectopic pregnancy. This is a pregnancy in which the fertilized egg attaches (implants) outside the uterus.  Any vaginal bleeding during pregnancy. If you already have antibodies in your blood, your  pregnancy will be closely monitored. You will not be given Rho (D) immune globulin because it is not effective in these cases. Summary  Rh incompatibility is a condition that occurs during pregnancy if a woman has Rh-negative blood and her baby has Rh-positive blood.  If a mother is Rh-negative and the baby is Rh-positive, the mother's immune system will react to the baby's blood as if it were a foreign substance, and will create antibodies.  Rh antibodies that attack and destroy the baby's red blood cells can lead to hemolytic disease in the baby.  This condition is treated with a shot of medicine called Rho (D) immune globulin. This medicine keeps the woman's body from making antibodies that can cause serious problems in the baby or future babies. This information is not intended to replace advice given to you by your health care provider. Make sure you discuss any questions you have with your health care provider. Document Revised: 05/19/2017 Document Reviewed: 08/02/2016 Elsevier Patient Education  2020 [employer]       Rh0 [D] Immune Globulin injection What is this medicine? RhO [D] IMMUNE GLOBULIN (i MYOON GLOB yoo lin) is used to treat idiopathic thrombocytopenic purpura (ITP). This medicine is used in RhO negative mothers who are pregnant with a RhO positive child. It is also used after a transfusion of RhO positive blood into a RhO negative person. This medicine may be used for other purposes; ask your health care provider or pharmacist if you have questions. COMMON BRAND NAME(S): BayRho-D, HyperRHO S/D, MICRhoGAM, RhoGAM, Rhophylac, WinRho SDF What should I tell my health care provider before I take this medicine? They need to know if you have any of these conditions:  bleeding disorders  low levels of immunoglobulin A in the body  no spleen  an unusual or allergic reaction to human immune globulin, other medicines, foods, dyes, or preservatives  pregnant or trying to  get pregnant  breast-feeding How should I use this medicine? This medicine is for injection into a muscle or into a vein. It is given by a health care professional in a hospital or clinic setting. Talk to your pediatrician regarding the use of this medicine in children. This medicine is not approved for use in children. Overdosage: If you think you have taken too much of this medicine contact a poison control center or emergency room at once. NOTE: This medicine is only for you. Do not share this medicine with others. What if I miss a dose? It is important not to miss your dose. Call your doctor or health care professional if you are unable to keep an appointment. What may interact with this medicine?  live virus vaccines, like measles, mumps, or rubella This list may not describe all possible interactions. Give your health care provider a list of all the medicines, herbs, non-prescription drugs, or dietary supplements you use. Also tell them if you smoke, drink alcohol, or use illegal drugs. Some items may interact with your medicine. What should I watch for while using this medicine? This medicine is made from human blood. It may be possible to pass an infection in this medicine. Talk to your doctor about the risks and benefits of this medicine. This medicine may interfere with live virus vaccines. Before you get live virus vaccines tell your health care professional if you have received this medicine within the past 3 months. What side effects may I notice from receiving this medicine? Side effects that you should report to your doctor or health care professional as soon as possible:  allergic reactions like skin rash, itching or hives, swelling of the face, lips, or tongue  breathing problems  chest pain or tightness  yellowing of the eyes or skin Side effects that usually do not require medical attention (report to your doctor or health care professional if they continue or are  bothersome):  fever  pain and tenderness at site where injected This list may not describe all possible side effects. Call your doctor for medical advice about side effects. You may report side effects to FDA at 1-800-FDA-1088. Where should I keep my medicine? This drug is given in a hospital or clinic and will not be stored at home. NOTE: This sheet is a summary. It may not cover all possible information. If you have questions about this medicine, talk to your doctor, pharmacist, or health care provider.  2020 Elsevier/Gold Standard (2008-02-04 14:06:10)

## 2019-12-11 LAB — CERVICOVAGINAL ANCILLARY ONLY
Bacterial Vaginitis (gardnerella): NEGATIVE
Candida Glabrata: NEGATIVE
Candida Vaginitis: NEGATIVE
Chlamydia: NEGATIVE
Comment: NEGATIVE
Comment: NEGATIVE
Comment: NEGATIVE
Comment: NEGATIVE
Comment: NEGATIVE
Comment: NORMAL
Neisseria Gonorrhea: NEGATIVE
Trichomonas: NEGATIVE

## 2019-12-12 ENCOUNTER — Other Ambulatory Visit: Payer: Self-pay

## 2019-12-12 ENCOUNTER — Other Ambulatory Visit: Payer: BC Managed Care – PPO

## 2019-12-12 DIAGNOSIS — Z3400 Encounter for supervision of normal first pregnancy, unspecified trimester: Secondary | ICD-10-CM

## 2019-12-14 LAB — AFP, SERUM, OPEN SPINA BIFIDA
AFP MoM: 1.11
AFP Value: 56 ng/mL
Gest. Age on Collection Date: 17.7 weeks
Maternal Age At EDD: 27.6 yr
OSBR Risk 1 IN: 8647
Test Results:: NEGATIVE
Weight: 118 [lb_av]

## 2019-12-17 ENCOUNTER — Telehealth: Payer: Self-pay | Admitting: *Deleted

## 2019-12-17 NOTE — Telephone Encounter (Signed)
Lloyd left a voice message this am that she is calling re: her [employer] bloodwork. States she went online to track progress and she needs tracking number.  Linda,RN

## 2019-12-17 NOTE — Telephone Encounter (Signed)
I called Naimah back and informed her I do not have a tracking number I can give her. She confirmed she did create her [employer] account and did enter her test kit number and it says should see within 72 hours. I explained due to kits being picked up Three times a week and also being sent to [place]; also shipping delays, etc that there may be a delay. I asked her to check back in a few days and that usually we see results 1-2 weeks. I asked her to check back and once it has been a week if she still isn't seeing anything to let us know. She voices understanding. Danial Hlavac,RN

## 2019-12-24 ENCOUNTER — Ambulatory Visit: Payer: BC Managed Care – PPO | Attending: Obstetrics and Gynecology

## 2019-12-24 ENCOUNTER — Other Ambulatory Visit: Payer: Self-pay

## 2019-12-24 ENCOUNTER — Ambulatory Visit: Payer: BC Managed Care – PPO

## 2019-12-24 DIAGNOSIS — Z3A19 19 weeks gestation of pregnancy: Secondary | ICD-10-CM | POA: Diagnosis not present

## 2019-12-24 DIAGNOSIS — Z363 Encounter for antenatal screening for malformations: Secondary | ICD-10-CM | POA: Diagnosis not present

## 2019-12-24 DIAGNOSIS — Z3400 Encounter for supervision of normal first pregnancy, unspecified trimester: Secondary | ICD-10-CM | POA: Insufficient documentation

## 2019-12-24 DIAGNOSIS — O360120 Maternal care for anti-D [Rh] antibodies, second trimester, not applicable or unspecified: Secondary | ICD-10-CM | POA: Diagnosis not present

## 2020-01-07 ENCOUNTER — Ambulatory Visit (INDEPENDENT_AMBULATORY_CARE_PROVIDER_SITE_OTHER): Payer: BC Managed Care – PPO | Admitting: Student

## 2020-01-07 ENCOUNTER — Other Ambulatory Visit: Payer: Self-pay

## 2020-01-07 DIAGNOSIS — Z3400 Encounter for supervision of normal first pregnancy, unspecified trimester: Secondary | ICD-10-CM

## 2020-01-07 DIAGNOSIS — Z6791 Unspecified blood type, Rh negative: Secondary | ICD-10-CM

## 2020-01-07 DIAGNOSIS — Z3402 Encounter for supervision of normal first pregnancy, second trimester: Secondary | ICD-10-CM

## 2020-01-07 DIAGNOSIS — Z3A21 21 weeks gestation of pregnancy: Secondary | ICD-10-CM

## 2020-01-07 NOTE — Progress Notes (Signed)
Pt states that she's still having yeast infection symptoms,thick white d/c.Two weeks ago she was having problems breathing at night. Lower sharp pain in abdomen & lower back.

## 2020-01-07 NOTE — Patient Instructions (Signed)

## 2020-01-07 NOTE — Progress Notes (Signed)
Patient ID: Ashley Greene, female   DOB: 1991/06/19, 29 y.o.   MRN: [id number]   PRENATAL VISIT NOTE  Subjective:  Ashley Greene is a 29 y.o. G1P0 at [redacted]w[redacted]d being seen today for ongoing prenatal care.  She is currently monitored for the following issues for this low-risk pregnancy and has Supervision of normal first pregnancy, antepartum; HSV-2 infection; Rh negative state in antepartum period; and Rubella immune on their problem list.  Patient reports some vaginal discharge, consistent with yeast but without any pain, or itching or odor. She also has some back pains and abdominal pains. No contractions, no LOF. . She denies dysuria, foul odor to her urine. She reports that she feels a "deep muscle" pulling in the front and then some sharp pains in her back that feel like nerve pain.  Contractions: Not present. Vag. Bleeding: None.  Movement: Present. Denies leaking of fluid.   The following portions of the patient's history were reviewed and updated as appropriate: allergies, current medications, past family history, past medical history, past social history, past surgical history and problem list.   Objective:   Vitals:   01/07/20 1125  BP: (!) 108/57  Pulse: 97  Weight: 124 lb 4.8 oz (56.4 kg)    Fetal Status: Fetal Heart Rate (bpm): 156 Fundal Height: 20 cm Movement: Present     General:  Alert, oriented and cooperative. Patient is in no acute distress.  Skin: Skin is warm and dry. No rash noted.   Cardiovascular: Normal heart rate noted  Respiratory: Normal respiratory effort, no problems with respiration noted  Abdomen: Soft, gravid, appropriate for gestational age.  Pain/Pressure: Present     Pelvic: Cervical exam deferred        Extremities: Normal range of motion.  Edema: None  Mental Status: Normal mood and affect. Normal behavior. Normal judgment and thought content.   Assessment and Plan:  Pregnancy: G1P0 at [redacted]w[redacted]d 1. Supervision of normal first pregnancy,  antepartum -Patient no CVA tenderness, no pain with urination so unlikley that back pain is related to UTI. Recommend yoga, walking, pool. Discussed physiologic changes and discomforts of pregnancy.   -Patient declines yeast treatment or wet prep test today -Patient will buy blood pressure cuff -Reviewed BP warning signs; answered questions about upcoming GTT -Patient will have virtual visit in 4 weeks  Preterm labor symptoms and general obstetric precautions including but not limited to vaginal bleeding, contractions, leaking of fluid and fetal movement were reviewed in detail with the patient. Please refer to After Visit Summary for other counseling recommendations.   Return in about 4 weeks (around 02/04/2020), or My Chart LROB.  Future Appointments  Date Time Provider Department Center  02/05/2020  9:55 AM Opp Bing, MD [facility]    Ashley Greene, [place]

## 2020-01-27 ENCOUNTER — Encounter: Payer: Self-pay | Admitting: *Deleted

## 2020-01-27 DIAGNOSIS — Z3400 Encounter for supervision of normal first pregnancy, unspecified trimester: Secondary | ICD-10-CM

## 2020-02-05 ENCOUNTER — Telehealth (INDEPENDENT_AMBULATORY_CARE_PROVIDER_SITE_OTHER): Payer: BC Managed Care – PPO | Admitting: Obstetrics and Gynecology

## 2020-02-05 VITALS — Wt 129.4 lb

## 2020-02-05 DIAGNOSIS — B009 Herpesviral infection, unspecified: Secondary | ICD-10-CM

## 2020-02-05 DIAGNOSIS — Z3400 Encounter for supervision of normal first pregnancy, unspecified trimester: Secondary | ICD-10-CM

## 2020-02-05 DIAGNOSIS — O26899 Other specified pregnancy related conditions, unspecified trimester: Secondary | ICD-10-CM

## 2020-02-05 DIAGNOSIS — O98512 Other viral diseases complicating pregnancy, second trimester: Secondary | ICD-10-CM

## 2020-02-05 DIAGNOSIS — Z6791 Unspecified blood type, Rh negative: Secondary | ICD-10-CM

## 2020-02-05 DIAGNOSIS — Z3A25 25 weeks gestation of pregnancy: Secondary | ICD-10-CM

## 2020-02-05 DIAGNOSIS — O26892 Other specified pregnancy related conditions, second trimester: Secondary | ICD-10-CM

## 2020-02-05 NOTE — Progress Notes (Signed)
   TELEHEALTH VIRTUAL OBSTETRICS VISIT ENCOUNTER NOTE  Clinic: Center for [facility]  I connected with Herbert Seta Tolles on 02/05/20 at  9:55 AM EDT by telephone at home and verified that I am speaking with the correct person using two identifiers.   I discussed the limitations, risks, security and privacy concerns of performing an evaluation and management service by telephone and the availability of in person appointments. I also discussed with the patient that there may be a patient responsible charge related to this service. The patient expressed understanding and agreed to proceed.  Subjective:  Ashley Greene is a 29 y.o. G1P0 at [redacted]w[redacted]d being followed for ongoing prenatal care.  She is currently monitored for the following issues for this low-risk pregnancy and has Supervision of normal first pregnancy, antepartum; HSV-2 infection; Rh negative state in antepartum period; and Rubella immune on their problem list.  Patient reports no complaints. Reports fetal movement. Denies any contractions, bleeding or leaking of fluid.   The following portions of the patient's history were reviewed and updated as appropriate: allergies, current medications, past family history, past medical history, past social history, past surgical history and problem list.   Objective:   Vitals:   02/05/20 1003  Weight: 129 lb 6.4 oz (58.7 kg)    Babyscripts Data Reviewed: not applicable  General:  Alert, oriented and cooperative.   Mental Status: Normal mood and affect perceived. Normal judgment and thought content.  Rest of physical exam deferred due to type of encounter  Assessment and Plan:  Pregnancy: G1P0 at [redacted]w[redacted]d 1. Supervision of normal first pregnancy, antepartum Follow up BP. 28wk labs, rhogam nv.  The patient was counseled on the potential benefits and lack of known risks of COVID vaccination, during pregnancy and breastfeeding, on today's visit. The patient's questions and concerns were  addressed today, including if her husband should be vaccinated or not. The patient is not planning to get vaccinated at this time but may postpartum. - Antibody screen; Future - VITAMIN D 25 Hydroxy (Vit-D Deficiency, Fractures); Future - CBC; Future - RPR; Future - Glucose Tolerance, 2 Hours w/1 Hour; Future - HIV antibody (with reflex); Future  2. HSV-2 infection H/o infection outside of pregnancy and had one earlier this pregnancy. Currently on acyclovir. Can increase dose closer to term  3. Rh negative state in antepartum period  Preterm labor symptoms and general obstetric precautions including but not limited to vaginal bleeding, contractions, leaking of fluid and fetal movement were reviewed in detail with the patient.  I discussed the assessment and treatment plan with the patient. The patient was provided an opportunity to ask questions and all were answered. The patient agreed with the plan and demonstrated an understanding of the instructions. The patient was advised to call back or seek an in-person office evaluation/go to [facility]U at [facility] for any urgent or concerning symptoms. Please refer to After Visit Summary for other counseling recommendations.   I provided 10 minutes of non-face-to-face time during this encounter. The visit was conducted via MyChart-medicine  Return in about 2 weeks (around 02/19/2020) for 2hr GTT, in person, low risk.  No future appointments.  Holly Bing, MD Center for [employer], [facility]

## 2020-02-05 NOTE — Progress Notes (Signed)
I connected with  Ashley Greene on 02/05/20 at  9:55 AM EDT by telephone and verified that I am speaking with the correct person using two identifiers.   I discussed the limitations, risks, security and privacy concerns of performing an evaluation and management service by telephone and the availability of in person appointments. I also discussed with the patient that there may be a patient responsible charge related to this service. The patient expressed understanding and agreed to proceed.  [name], CMA 02/05/2020  10:04 AM

## 2020-02-25 ENCOUNTER — Other Ambulatory Visit: Payer: Self-pay

## 2020-02-25 ENCOUNTER — Ambulatory Visit (INDEPENDENT_AMBULATORY_CARE_PROVIDER_SITE_OTHER): Payer: BC Managed Care – PPO | Admitting: Certified Nurse Midwife

## 2020-02-25 ENCOUNTER — Other Ambulatory Visit: Payer: BC Managed Care – PPO

## 2020-02-25 VITALS — BP 120/74 | HR 107 | Wt 131.0 lb

## 2020-02-25 DIAGNOSIS — Z3400 Encounter for supervision of normal first pregnancy, unspecified trimester: Secondary | ICD-10-CM

## 2020-02-25 DIAGNOSIS — Z3A28 28 weeks gestation of pregnancy: Secondary | ICD-10-CM

## 2020-02-25 DIAGNOSIS — Z3483 Encounter for supervision of other normal pregnancy, third trimester: Secondary | ICD-10-CM | POA: Diagnosis not present

## 2020-02-25 MED ORDER — RHO D IMMUNE GLOBULIN 1500 UNIT/2ML IJ SOSY
300.0000 ug | PREFILLED_SYRINGE | Freq: Once | INTRAMUSCULAR | Status: AC
  Administered 2020-02-25: 300 ug via INTRAMUSCULAR

## 2020-02-25 NOTE — Progress Notes (Signed)
   PRENATAL VISIT NOTE  Subjective:  Ashley Greene is a 29 y.o. G1P0 at [redacted]w[redacted]d being seen today for ongoing prenatal care.  She is currently monitored for the following issues for this low-risk pregnancy and has Supervision of normal first pregnancy, antepartum; HSV-2 infection; Rh negative state in antepartum period; and Rubella immune on their problem list.  Patient reports no complaints.  Contractions: Not present. Vag. Bleeding: None.  Movement: Present. Denies leaking of fluid.   The following portions of the patient's history were reviewed and updated as appropriate: allergies, current medications, past family history, past medical history, past social history, past surgical history and problem list.   Objective:   Vitals:   02/25/20 1003  BP: 120/74  Pulse: (!) 107  Weight: 131 lb (59.4 kg)    Fetal Status: Fetal Heart Rate (bpm): 131   Movement: Present     General:  Alert, oriented and cooperative. Patient is in no acute distress.  Skin: Skin is warm and dry. No rash noted.   Cardiovascular: Normal heart rate noted  Respiratory: Normal respiratory effort, no problems with respiration noted  Abdomen: Soft, gravid, appropriate for gestational age.  Pain/Pressure: Present     Pelvic: Cervical exam deferred        Extremities: Normal range of motion.  Edema: None  Mental Status: Normal mood and affect. Normal behavior. Normal judgment and thought content.   Assessment and Plan:  Pregnancy: G1P0 at [redacted]w[redacted]d 1. Encounter for supervision of other normal pregnancy in third trimester - Pt requested vitamin D test in relation to prevention of Covid-19, she is currently taking supplemental vitamin D and was told in her last visit that appropriate vitamin D levels are protective against Covid infection - Discussed clinically proven prevention methods and encouraged her to get the Covid vaccine as soon as possible   2. [redacted] weeks gestation of pregnancy - rho (d) immune globulin  (RHIG/RHOPHYLAC) injection 300 mcg  Preterm labor symptoms and general obstetric precautions including but not limited to vaginal bleeding, contractions, leaking of fluid and fetal movement were reviewed in detail with the patient. Please refer to After Visit Summary for other counseling recommendations.    Future Appointments  Date Time Provider Department Center  03/17/2020 10:15 AM Marvetta Gibbons, Brand Males, NP [facility]   Edd Arbour, CNM, MSN, [facility] 02/25/20 1:29 PM

## 2020-02-26 ENCOUNTER — Encounter: Payer: BC Managed Care – PPO | Admitting: Obstetrics and Gynecology

## 2020-02-26 ENCOUNTER — Other Ambulatory Visit: Payer: BC Managed Care – PPO

## 2020-02-26 LAB — GLUCOSE TOLERANCE, 2 HOURS W/ 1HR
Glucose, 1 hour: 130 mg/dL (ref 65–179)
Glucose, 2 hour: 107 mg/dL (ref 65–152)
Glucose, Fasting: 71 mg/dL (ref 65–91)

## 2020-02-26 LAB — CBC
Hematocrit: 32 % — ABNORMAL LOW (ref 34.0–46.6)
Hemoglobin: 10.6 g/dL — ABNORMAL LOW (ref 11.1–15.9)
MCH: 32.2 pg (ref 26.6–33.0)
MCHC: 33.1 g/dL (ref 31.5–35.7)
MCV: 97 fL (ref 79–97)
Platelets: 286 10*3/uL (ref 150–450)
RBC: 3.29 x10E6/uL — ABNORMAL LOW (ref 3.77–5.28)
RDW: 12.1 % (ref 11.7–15.4)
WBC: 10.6 10*3/uL (ref 3.4–10.8)

## 2020-02-26 LAB — HIV ANTIBODY (ROUTINE TESTING W REFLEX): HIV Screen 4th Generation wRfx: NONREACTIVE

## 2020-02-26 LAB — RPR: RPR Ser Ql: NONREACTIVE

## 2020-02-26 LAB — VITAMIN D 25 HYDROXY (VIT D DEFICIENCY, FRACTURES): Vit D, 25-Hydroxy: 37.5 ng/mL (ref 30.0–100.0)

## 2020-02-26 LAB — ANTIBODY SCREEN: Antibody Screen: NEGATIVE

## 2020-03-06 DIAGNOSIS — Z20828 Contact with and (suspected) exposure to other viral communicable diseases: Secondary | ICD-10-CM | POA: Diagnosis not present

## 2020-03-13 DIAGNOSIS — Z20828 Contact with and (suspected) exposure to other viral communicable diseases: Secondary | ICD-10-CM | POA: Diagnosis not present

## 2020-03-17 ENCOUNTER — Other Ambulatory Visit: Payer: Self-pay

## 2020-03-17 ENCOUNTER — Ambulatory Visit (INDEPENDENT_AMBULATORY_CARE_PROVIDER_SITE_OTHER): Payer: BC Managed Care – PPO | Admitting: Nurse Practitioner

## 2020-03-17 VITALS — BP 104/69 | Wt 133.8 lb

## 2020-03-17 DIAGNOSIS — Z23 Encounter for immunization: Secondary | ICD-10-CM | POA: Diagnosis not present

## 2020-03-17 DIAGNOSIS — Z3400 Encounter for supervision of normal first pregnancy, unspecified trimester: Secondary | ICD-10-CM | POA: Diagnosis not present

## 2020-03-17 DIAGNOSIS — Z3A31 31 weeks gestation of pregnancy: Secondary | ICD-10-CM

## 2020-03-17 NOTE — Patient Instructions (Addendum)
[facility] [email or web address] for childbirth and breastfeeding classes Third Trimester of Pregnancy  The third trimester is from week 28 through week 40 (months 7 through 9). This trimester is when your unborn baby (fetus) is growing very fast. At the end of the ninth month, the unborn baby is about 20 inches in length. It weighs about 6-10 pounds. Follow these instructions at home: Medicines  Take over-the-counter and prescription medicines only as told by your doctor. Some medicines are safe and some medicines are not safe during pregnancy.  Take a prenatal vitamin that contains at least 600 micrograms (mcg) of folic acid.  If you have trouble pooping (constipation), take medicine that will make your stool soft (stool softener) if your doctor approves. Eating and drinking   Eat regular, healthy meals.  Avoid raw meat and uncooked cheese.  If you get low calcium from the food you eat, talk to your doctor about taking a daily calcium supplement.  Eat four or five small meals rather than three large meals a day.  Avoid foods that are high in fat and sugars, such as fried and sweet foods.  To prevent constipation: ? Eat foods that are high in fiber, like fresh fruits and vegetables, whole grains, and beans. ? Drink enough fluids to keep your pee (urine) clear or pale yellow. Activity  Exercise only as told by your doctor. Stop exercising if you start to have cramps.  Avoid heavy lifting, wear low heels, and sit up straight.  Do not exercise if it is too hot, too humid, or if you are in a place of great height (high altitude).  You may continue to have sex unless your doctor tells you not to. Relieving pain and discomfort  Wear a good support bra if your breasts are tender.  Take frequent breaks and rest with your legs raised if you have leg cramps or low back pain.  Take warm water baths (sitz baths) to soothe pain or discomfort caused by hemorrhoids. Use hemorrhoid cream if your  doctor approves.  If you develop puffy, bulging veins (varicose veins) in your legs: ? Wear support hose or compression stockings as told by your doctor. ? Raise (elevate) your feet for 15 minutes, 3-4 times a day. ? Limit salt in your food. Safety  Wear your seat belt when driving.  Make a list of emergency phone numbers, including numbers for family, friends, the hospital, and police and fire departments. Preparing for your baby's arrival To prepare for the arrival of your baby:  Take prenatal classes.  Practice driving to the hospital.  Visit the hospital and tour the maternity area.  Talk to your work about taking leave once the baby comes.  Pack your hospital bag.  Prepare the baby's room.  Go to your doctor visits.  Buy a rear-facing car seat. Learn how to install it in your car. General instructions  Do not use hot tubs, steam rooms, or saunas.  Do not use any products that contain nicotine or tobacco, such as cigarettes and e-cigarettes. If you need help quitting, ask your doctor.  Do not drink alcohol.  Do not douche or use tampons or scented sanitary pads.  Do not cross your legs for long periods of time.  Do not travel for long distances unless you must. Only do so if your doctor says it is okay.  Visit your dentist if you have not gone during your pregnancy. Use a soft toothbrush to brush your teeth. Be gentle when you  floss.  Avoid cat litter boxes and soil used by cats. These carry germs that can cause birth defects in the baby and can cause a loss of your baby (miscarriage) or stillbirth.  Keep all your prenatal visits as told by your doctor. This is important. Contact a doctor if:  You are not sure if you are in labor or if your water has broken.  You are dizzy.  You have mild cramps or pressure in your lower belly.  You have a nagging pain in your belly area.  You continue to feel sick to your stomach, you throw up, or you have watery  poop.  You have bad smelling fluid coming from your vagina.  You have pain when you pee. Get help right away if:  You have a fever.  You are leaking fluid from your vagina.  You are spotting or bleeding from your vagina.  You have severe belly cramps or pain.  You lose or gain weight quickly.  You have trouble catching your breath and have chest pain.  You notice sudden or extreme puffiness (swelling) of your face, hands, ankles, feet, or legs.  You have not felt the baby move in over an hour.  You have severe headaches that do not go away with medicine.  You have trouble seeing.  You are leaking, or you are having a gush of fluid, from your vagina before you are 37 weeks.  You have regular belly spasms (contractions) before you are 37 weeks. Summary  The third trimester is from week 28 through week 40 (months 7 through 9). This time is when your unborn baby is growing very fast.  Follow your doctor's advice about medicine, food, and activity.  Get ready for the arrival of your baby by taking prenatal classes, getting all the baby items ready, preparing the baby's room, and visiting your doctor to be checked.  Get help right away if you are bleeding from your vagina, or you have chest pain and trouble catching your breath, or if you have not felt your baby move in over an hour. This information is not intended to replace advice given to you by your health care provider. Make sure you discuss any questions you have with your health care provider. Document Revised: 09/27/2018 Document Reviewed: 07/12/2016 Elsevier Patient Education  2020 [employer].

## 2020-03-17 NOTE — Progress Notes (Signed)
    Subjective:  Ashley Greene is a 29 y.o. G1P0 at [redacted]w[redacted]d being seen today for ongoing prenatal care.  She is currently monitored for the following issues for this low-risk pregnancy and has Supervision of normal first pregnancy, antepartum; HSV-2 infection; Rh negative state in antepartum period; and Rubella immune on their problem list.  Patient reports no complaints.  Contractions: Not present. Vag. Bleeding: None.  Movement: Present. Denies leaking of fluid.   The following portions of the patient's history were reviewed and updated as appropriate: allergies, current medications, past family history, past medical history, past social history, past surgical history and problem list. Problem list updated.  Objective:   Vitals:   03/17/20 1105  BP: 104/69  Weight: 133 lb 12.8 oz (60.7 kg)    Fetal Status: Fetal Heart Rate (bpm): 141 Fundal Height: 31 cm Movement: Present     General:  Alert, oriented and cooperative. Patient is in no acute distress.  Skin: Skin is warm and dry. No rash noted.   Cardiovascular: Normal heart rate noted  Respiratory: Normal respiratory effort, no problems with respiration noted  Abdomen: Soft, gravid, appropriate for gestational age. Pain/Pressure: Absent     Pelvic:  Cervical exam deferred        Extremities: Normal range of motion.  Edema: None  Mental Status: Normal mood and affect. Normal behavior. Normal judgment and thought content.   Urinalysis:      Assessment and Plan:  Pregnancy: G1P0 at [redacted]w[redacted]d  1. Supervision of normal first pregnancy, antepartum Doing well Will call about pediatrician Is signed up for waterbirth class Recommended Childbirth and Breastfeeding classes as well Advised to contact support at Babyscripts and be able to enter BPs into babyscripts  - Tdap vaccine greater than or equal to 7yo IM  2. [redacted] weeks gestation of pregnancy   Preterm labor symptoms and general obstetric precautions including but not limited to  vaginal bleeding, contractions, leaking of fluid and fetal movement were reviewed in detail with the patient. Please refer to After Visit Summary for other counseling recommendations.  Return in about 2 weeks (around 03/31/2020) for CNM appt to discuss water birth.  Nolene Bernheim, RN, MSN, NP-BC Nurse Practitioner, [facility] for [employer], [facility] 03/17/2020 2:17 PM

## 2020-03-18 DIAGNOSIS — Z20828 Contact with and (suspected) exposure to other viral communicable diseases: Secondary | ICD-10-CM | POA: Diagnosis not present

## 2020-03-25 DIAGNOSIS — Z20828 Contact with and (suspected) exposure to other viral communicable diseases: Secondary | ICD-10-CM | POA: Diagnosis not present

## 2020-03-26 ENCOUNTER — Inpatient Hospital Stay (HOSPITAL_COMMUNITY)
Admission: AD | Admit: 2020-03-26 | Discharge: 2020-03-26 | Disposition: A | Discharge status: Home / Self Care | Payer: BC Managed Care – PPO | Attending: Obstetrics and Gynecology | Admitting: Obstetrics and Gynecology

## 2020-03-26 ENCOUNTER — Encounter (HOSPITAL_COMMUNITY): Payer: Self-pay | Admitting: Obstetrics and Gynecology

## 2020-03-26 ENCOUNTER — Inpatient Hospital Stay (HOSPITAL_COMMUNITY): Payer: BC Managed Care – PPO

## 2020-03-26 ENCOUNTER — Other Ambulatory Visit: Payer: Self-pay

## 2020-03-26 ENCOUNTER — Telehealth: Payer: Self-pay | Admitting: Lactation Services

## 2020-03-26 DIAGNOSIS — Z79899 Other long term (current) drug therapy: Secondary | ICD-10-CM | POA: Insufficient documentation

## 2020-03-26 DIAGNOSIS — B009 Herpesviral infection, unspecified: Secondary | ICD-10-CM

## 2020-03-26 DIAGNOSIS — Z3A32 32 weeks gestation of pregnancy: Secondary | ICD-10-CM | POA: Diagnosis not present

## 2020-03-26 DIAGNOSIS — O99513 Diseases of the respiratory system complicating pregnancy, third trimester: Secondary | ICD-10-CM | POA: Diagnosis not present

## 2020-03-26 DIAGNOSIS — R509 Fever, unspecified: Secondary | ICD-10-CM | POA: Diagnosis not present

## 2020-03-26 DIAGNOSIS — R0602 Shortness of breath: Secondary | ICD-10-CM | POA: Diagnosis not present

## 2020-03-26 DIAGNOSIS — Z880 Allergy status to penicillin: Secondary | ICD-10-CM | POA: Insufficient documentation

## 2020-03-26 DIAGNOSIS — R0600 Dyspnea, unspecified: Secondary | ICD-10-CM | POA: Diagnosis not present

## 2020-03-26 DIAGNOSIS — J069 Acute upper respiratory infection, unspecified: Secondary | ICD-10-CM | POA: Insufficient documentation

## 2020-03-26 DIAGNOSIS — Z20822 Contact with and (suspected) exposure to covid-19: Secondary | ICD-10-CM | POA: Diagnosis not present

## 2020-03-26 DIAGNOSIS — Z3400 Encounter for supervision of normal first pregnancy, unspecified trimester: Secondary | ICD-10-CM

## 2020-03-26 DIAGNOSIS — Z3689 Encounter for other specified antenatal screening: Secondary | ICD-10-CM

## 2020-03-26 LAB — COMPREHENSIVE METABOLIC PANEL
ALT: 47 U/L — ABNORMAL HIGH (ref 0–44)
AST: 30 U/L (ref 15–41)
Albumin: 2.8 g/dL — ABNORMAL LOW (ref 3.5–5.0)
Alkaline Phosphatase: 133 U/L — ABNORMAL HIGH (ref 38–126)
Anion gap: 9 (ref 5–15)
BUN: 9 mg/dL (ref 6–20)
CO2: 22 mmol/L (ref 22–32)
Calcium: 8.7 mg/dL — ABNORMAL LOW (ref 8.9–10.3)
Chloride: 104 mmol/L (ref 98–111)
Creatinine, Ser: 0.5 mg/dL (ref 0.44–1.00)
GFR calc non Af Amer: >60 mL/min (ref >60)
Glucose, Bld: 98 mg/dL (ref 70–99)
Potassium: 4.1 mmol/L (ref 3.5–5.1)
Sodium: 135 mmol/L (ref 135–145)
Total Bilirubin: 0.4 mg/dL (ref 0.3–1.2)
Total Protein: 6.1 g/dL — ABNORMAL LOW (ref 6.5–8.1)

## 2020-03-26 LAB — URINALYSIS, ROUTINE W REFLEX MICROSCOPIC
Bilirubin Urine: NEGATIVE
Glucose, UA: NEGATIVE mg/dL
Hgb urine dipstick: NEGATIVE
Ketones, ur: NEGATIVE mg/dL
Leukocytes,Ua: NEGATIVE
Nitrite: NEGATIVE
Protein, ur: NEGATIVE mg/dL
Specific Gravity, Urine: 1.014 (ref 1.005–1.030)
pH: 7 (ref 5.0–8.0)

## 2020-03-26 LAB — CBC WITH DIFFERENTIAL/PLATELET
Abs Immature Granulocytes: 0.07 10*3/uL (ref 0.00–0.07)
Basophils Absolute: 0.1 10*3/uL (ref 0.0–0.1)
Basophils Relative: 1 %
Eosinophils Absolute: 0.1 10*3/uL (ref 0.0–0.5)
Eosinophils Relative: 1 %
HCT: 32.5 % — ABNORMAL LOW (ref 36.0–46.0)
Hemoglobin: 10.8 g/dL — ABNORMAL LOW (ref 12.0–15.0)
Immature Granulocytes: 1 %
Lymphocytes Relative: 17 %
Lymphs Abs: 1.8 10*3/uL (ref 0.7–4.0)
MCH: 31.1 pg (ref 26.0–34.0)
MCHC: 33.2 g/dL (ref 30.0–36.0)
MCV: 93.7 fL (ref 80.0–100.0)
Monocytes Absolute: 0.9 10*3/uL (ref 0.1–1.0)
Monocytes Relative: 9 %
Neutro Abs: 7.5 10*3/uL (ref 1.7–7.7)
Neutrophils Relative %: 71 %
Platelets: 331 10*3/uL (ref 150–400)
RBC: 3.47 MIL/uL — ABNORMAL LOW (ref 3.87–5.11)
RDW: 12.6 % (ref 11.5–15.5)
WBC: 10.4 10*3/uL (ref 4.0–10.5)
nRBC: 0 % (ref 0.0–0.2)

## 2020-03-26 LAB — RESPIRATORY PANEL BY RT PCR (FLU A&B, COVID)
Influenza A by PCR: NEGATIVE
Influenza B by PCR: NEGATIVE
SARS Coronavirus 2 by RT PCR: NEGATIVE

## 2020-03-26 LAB — TROPONIN I (HIGH SENSITIVITY)
Troponin I (High Sensitivity): 4 ng/L (ref <18)
Troponin I (High Sensitivity): 9 ng/L (ref <18)

## 2020-03-26 LAB — BRAIN NATRIURETIC PEPTIDE: B Natriuretic Peptide: 38.5 pg/mL (ref 0.0–100.0)

## 2020-03-26 NOTE — MAU Note (Signed)
Has been sick for the last 5 days, low grade fever of 100, over the weekend,  Diarrhea(loose, 2x/day), muscle aches, tiredness, coughing up a  thick green d/c. Last 2 days has had difficulty breathing, easily winded with any activity. No loss of taste or sense of smell.  Reports +FM

## 2020-03-26 NOTE — Discharge Instructions (Signed)

## 2020-03-26 NOTE — Telephone Encounter (Signed)
Called patient as she has not answered My Chart message about going to the hospital. Patient did not answer and mailbox was full and could not leave a message.

## 2020-03-26 NOTE — MAU Provider Note (Addendum)
History     CSN: [id number]  Arrival date and time: 03/26/20 1532   First Provider Initiated Contact with Patient 03/26/20 1619      Chief Complaint  Patient presents with  . Shortness of Breath  . Fever  . Diarrhea  . Generalized Body Aches  . Fatigue   29 y.o. G1 @32 .6 wks presenting with nasal congestion, low grade fever, body aches, and SOB. Sx started 6 days ago. Reports green nasal drainage. Fever was 5 days ago, low grade. SOB started yesterday, happens while conversing, ambulating, and lying down. Denies cough. No CP. No loss of taste or smell. No known exposure to Covid. Had first dose of Moderna 03/12/20. Also reports few episodes of diarrhea.   OB History    Gravida  1   Para      Term      Preterm      AB      Living        SAB      TAB      Ectopic      Multiple      Live Births              History reviewed. No pertinent past medical history.  Past Surgical History:  Procedure Laterality Date  . WISDOM TOOTH EXTRACTION      Family History  Problem Relation Age of Onset  . Healthy Mother   . Healthy Father     Social History   Tobacco Use  . Smoking status: Never Smoker  . Smokeless tobacco: Never Used  Substance Use Topics  . Alcohol use: No  . Drug use: No    Allergies:  Allergies  Allergen Reactions  . Amoxicillin     Stomach upset   . Valtrex [Valacyclovir]     Stomach upset     Medications Prior to Admission  Medication Sig Dispense Refill Last Dose  . acetaminophen (TYLENOL) 500 MG tablet Take 500 mg by mouth every 6 (six) hours as needed.     03/14/20 acyclovir (ZOVIRAX) 400 MG tablet Take 400 mg by mouth 2 (two) times daily.      Marland Kitchen augmented betamethasone dipropionate (DIPROLENE-AF) 0.05 % cream Apply topically 2 (two) times daily.     . Prenatal Vit-Fe Fumarate-FA (PRENATAL VITAMINS PO) Take by mouth.       Review of Systems  Constitutional: Positive for fever. Negative for chills.  HENT: Positive for congestion  and rhinorrhea.   Respiratory: Positive for shortness of breath. Negative for cough.   Cardiovascular: Negative for chest pain.  Gastrointestinal: Positive for diarrhea. Negative for abdominal pain, nausea and vomiting.  Genitourinary: Negative for vaginal bleeding.   Physical Exam   Blood pressure 115/72, pulse (!) 101, temperature 98.7 F (37.1 C), temperature source Oral, resp. rate (!) 24, height 5\' 2"  (1.575 m), weight 60.9 kg, last menstrual period 08/09/2019, SpO2 98 %.  Physical Exam Vitals and nursing note reviewed.  Constitutional:      General: She is not in acute distress.    Appearance: Normal appearance.  HENT:     Head: Normocephalic and atraumatic.  Cardiovascular:     Rate and Rhythm: Regular rhythm. Tachycardia present.  Pulmonary:     Effort: Pulmonary effort is normal. Tachypnea (resp 24/min) present. No respiratory distress.     Breath sounds: Normal breath sounds. No stridor. No wheezing, rhonchi or rales.  Musculoskeletal:        General: Normal range of motion.  Skin:    General: Skin is warm and dry.  Neurological:     General: No focal deficit present.     Mental Status: She is alert and oriented to person, place, and time.  Psychiatric:        Mood and Affect: Mood normal.        Behavior: Behavior normal.   EFM: 150 bpm, mod variability, + accels, no decels Toco: UI  Results for orders placed or performed during the hospital encounter of 03/26/20 (from the past 24 hour(s))  CBC with Differential     Status: Abnormal   Collection Time: 03/26/20  3:50 PM  Result Value Ref Range   WBC 10.4 4.0 - 10.5 K/uL   RBC 3.47 (L) 3.87 - 5.11 MIL/uL   Hemoglobin 10.8 (L) 12.0 - 15.0 g/dL   HCT 09.6 (L) 36 - 46 %   MCV 93.7 80.0 - 100.0 fL   MCH 31.1 26.0 - 34.0 pg   MCHC 33.2 30.0 - 36.0 g/dL   RDW 28.3 66.2 - 94.7 %   Platelets 331 150 - 400 K/uL   nRBC 0.0 0.0 - 0.2 %   Neutrophils Relative % 71 %   Neutro Abs 7.5 1.7 - 7.7 K/uL   Lymphocytes  Relative 17 %   Lymphs Abs 1.8 0.7 - 4.0 K/uL   Monocytes Relative 9 %   Monocytes Absolute 0.9 0.1 - 1.0 K/uL   Eosinophils Relative 1 %   Eosinophils Absolute 0.1 0 - 0 K/uL   Basophils Relative 1 %   Basophils Absolute 0.1 0 - 0 K/uL   Immature Granulocytes 1 %   Abs Immature Granulocytes 0.07 0.00 - 0.07 K/uL  Comprehensive metabolic panel     Status: Abnormal   Collection Time: 03/26/20  3:50 PM  Result Value Ref Range   Sodium 135 135 - 145 mmol/L   Potassium 4.1 3.5 - 5.1 mmol/L   Chloride 104 98 - 111 mmol/L   CO2 22 22 - 32 mmol/L   Glucose, Bld 98 70 - 99 mg/dL   BUN 9 6 - 20 mg/dL   Creatinine, Ser 6.54 0.44 - 1.00 mg/dL   Calcium 8.7 (L) 8.9 - 10.3 mg/dL   Total Protein 6.1 (L) 6.5 - 8.1 g/dL   Albumin 2.8 (L) 3.5 - 5.0 g/dL   AST 30 15 - 41 U/L   ALT 47 (H) 0 - 44 U/L   Alkaline Phosphatase 133 (H) 38 - 126 U/L   Total Bilirubin 0.4 0.3 - 1.2 mg/dL   GFR calc non Af Amer >60 >60 mL/min   Anion gap 9 5 - 15  Brain natriuretic peptide     Status: None   Collection Time: 03/26/20  3:50 PM  Result Value Ref Range   B Natriuretic Peptide 38.5 0.0 - 100.0 pg/mL  Troponin I (High Sensitivity)     Status: None   Collection Time: 03/26/20  3:50 PM  Result Value Ref Range   Troponin I (High Sensitivity) 4 <18 ng/L  Respiratory Panel by RT PCR (Flu A&B, Covid) - Nasopharyngeal Swab     Status: None   Collection Time: 03/26/20  4:23 PM   Specimen: Nasopharyngeal Swab  Result Value Ref Range   SARS Coronavirus 2 by RT PCR NEGATIVE NEGATIVE   Influenza A by PCR NEGATIVE NEGATIVE   Influenza B by PCR NEGATIVE NEGATIVE  Urinalysis, Routine w reflex microscopic Urine, Clean Catch     Status: None   Collection  Time: 03/26/20  4:27 PM  Result Value Ref Range   Color, Urine YELLOW YELLOW   APPearance CLEAR CLEAR   Specific Gravity, Urine 1.014 1.005 - 1.030   pH 7.0 5.0 - 8.0   Glucose, UA NEGATIVE NEGATIVE mg/dL   Hgb urine dipstick NEGATIVE NEGATIVE   Bilirubin Urine  NEGATIVE NEGATIVE   Ketones, ur NEGATIVE NEGATIVE mg/dL   Protein, ur NEGATIVE NEGATIVE mg/dL   Nitrite NEGATIVE NEGATIVE   Leukocytes,Ua NEGATIVE NEGATIVE   DG Chest 1 View  Result Date: 03/26/2020 CLINICAL DATA:  Dyspnea EXAM: CHEST  1 VIEW COMPARISON:  None. FINDINGS: The heart size and mediastinal contours are within normal limits. Both lungs are clear. The visualized skeletal structures are unremarkable. IMPRESSION: No active disease. Electronically Signed   By: Helyn Numbers MD   On: 03/26/2020 16:42   MAU Course  Procedures  MDM Labs ordered and reviewed. No signs of Covid or flu. Not hypoxic. Discussed treatment for upper resp virus. Stable for discharge home.   Assessment and Plan  [redacted] weeks gestation Reactive NST Upper respiratory virus Discharge home Follow up at [facility] as scheduled Return for worsening sx  Allergies as of 03/26/2020      Reactions   Amoxicillin    Stomach upset    Valtrex [valacyclovir]    Stomach upset       Medication List    STOP taking these medications   augmented betamethasone dipropionate 0.05 % cream Commonly known as: DIPROLENE-AF     TAKE these medications   acetaminophen 500 MG tablet Commonly known as: TYLENOL Take 500 mg by mouth every 6 (six) hours as needed.   acyclovir 400 MG tablet Commonly known as: ZOVIRAX Take 400 mg by mouth 2 (two) times daily.   PRENATAL VITAMINS PO Take by mouth.      Donette Larry, CNM 03/26/2020, 6:17 PM

## 2020-03-30 ENCOUNTER — Telehealth: Payer: Self-pay | Admitting: Family Medicine

## 2020-03-30 NOTE — Telephone Encounter (Signed)
Patient has a really bad cold and want a nurse to call her

## 2020-03-31 ENCOUNTER — Emergency Department: Admit: 2020-03-31 | Discharge status: Absent without leave | Payer: Self-pay

## 2020-03-31 ENCOUNTER — Encounter: Payer: Self-pay | Admitting: Emergency Medicine

## 2020-03-31 ENCOUNTER — Ambulatory Visit (HOSPITAL_COMMUNITY): Admit: 2020-03-31 | Disposition: A | Discharge status: Home / Self Care | Payer: BC Managed Care – PPO

## 2020-03-31 ENCOUNTER — Emergency Department
Admission: EM | Admit: 2020-03-31 | Discharge: 2020-03-31 | Disposition: A | Discharge status: Home / Self Care | Payer: BC Managed Care – PPO | Source: Home / Self Care | Attending: Family Medicine | Admitting: Family Medicine

## 2020-03-31 ENCOUNTER — Other Ambulatory Visit: Payer: Self-pay

## 2020-03-31 DIAGNOSIS — R0981 Nasal congestion: Secondary | ICD-10-CM

## 2020-03-31 DIAGNOSIS — J069 Acute upper respiratory infection, unspecified: Secondary | ICD-10-CM | POA: Diagnosis not present

## 2020-03-31 MED ORDER — CEPHALEXIN 500 MG PO CAPS: 500.0000 mg | ORAL_CAPSULE | Freq: Three times a day (TID) | ORAL | 0 refills | Status: DC

## 2020-03-31 NOTE — ED Triage Notes (Addendum)
Thick green nasal discharge x 10 days Has had 1 Covid vaccination

## 2020-03-31 NOTE — Discharge Instructions (Addendum)
  May use Afrin nasal spray (or generic oxymetazoline) each morning for about 5 days and then discontinue.  Also recommend using saline nasal spray several times daily and saline nasal irrigation (AYR is a common brand).  Use Flonase nasal spray each morning after using Afrin nasal spray and saline nasal irrigation.   Begin cephalexin if not improving in 7 to 10 days.

## 2020-03-31 NOTE — ED Provider Notes (Signed)
[facility]    CSN: [id number] Arrival date & time: 03/31/20  1223      History   Chief Complaint Chief Complaint  Patient presents with  . Sinus Problem    HPI Ashley Greene is a 29 y.o. female.   Patient states that she had a typical URI about 1.5 weeks ago that has improved except for persistent thick nasal drainage and facial pressure.  She denies fevers, chills, and sweats.  She is present 8 months pregnant.  She has had one COVID vaccination.   The history is provided by the patient.    History reviewed. No pertinent past medical history.  Patient Active Problem List   Diagnosis Date Noted  . Rh negative state in antepartum period 11/11/2019  . Rubella immune 11/11/2019  . Supervision of normal first pregnancy, antepartum 10/15/2019  . 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 WISDOM TOOTH EXTRACTION      OB History    Gravida  1   Para      Term      Preterm      AB      Living        SAB      TAB      Ectopic      Multiple      Live Births               Home Medications    Prior to Admission medications   Medication Sig Start Date End Date Taking? Authorizing Provider  acetaminophen (TYLENOL) 500 MG tablet Take 500 mg by mouth every 6 (six) hours as needed.    [provider]  acyclovir (ZOVIRAX) 400 MG tablet Take 400 mg by mouth 2 (two) times daily.     [provider]  cephALEXin (KEFLEX) 500 MG capsule Take 1 capsule (500 mg total) by mouth 3 (three) times daily. 03/31/20   Lattie Haw, MD  Prenatal Vit-Fe Fumarate-FA (PRENATAL VITAMINS PO) Take by mouth.    [provider]    Family History Family History  Problem Relation Age of Onset  . Healthy Mother   . Healthy Father     Social History Social History   Tobacco Use  . Smoking status: Never Smoker  . Smokeless tobacco: Never Used  Vaping Use  . Vaping Use: Never used    Substance Use Topics  . Alcohol use: No  . Drug use: No     Allergies   Amoxicillin and Valtrex [valacyclovir]   Review of Systems Review of Systems No sore throat No cough No pleuritic pain No wheezing + nasal congestion + post-nasal drainage + sinus pain/pressure No itchy/red eyes No earache No hemoptysis No SOB No fever/chills No nausea No vomiting No abdominal pain No diarrhea No urinary symptoms No skin rash No fatigue No myalgias No headache Used OTC meds without relief   Physical Exam Triage Vital Signs ED Triage Vitals [03/31/20 1245]  Enc Vitals Group     BP 101/69     Pulse Rate 88     Resp      Temp 98.3 F (36.8 C)     Temp Source Oral     SpO2 97 %     Weight 134 lb (60.8 kg)     Height 5\' 2"  (1.575 m)     Head Circumference      Peak Flow      Pain Score  0     Pain Loc      Pain Edu?      Excl. in GC?    No data found.  Updated Vital Signs BP 101/69 (BP Location: Right Arm)   Pulse 88   Temp 98.3 F (36.8 C) (Oral)   Ht 5\' 2"  (1.575 m)   Wt 60.8 kg   LMP 08/09/2019 (Exact Date)   SpO2 97%   BMI 24.51 kg/m   Visual Acuity Right Eye Distance:   Left Eye Distance:   Bilateral Distance:    Right Eye Near:   Left Eye Near:    Bilateral Near:     Physical Exam Nursing notes and Vital Signs reviewed. Appearance:  Patient appears stated age, and in no acute distress Eyes:  Pupils are equal, round, and reactive to light and accomodation.  Extraocular movement is intact.  Conjunctivae are not inflamed  Ears:  Canals normal.  Tympanic membranes normal.  Nose:  Mildly congested turbinates.  No sinus tenderness.  Pharynx:  Normal Neck:  Supple.  No adenopathy. Lungs:  Clear to auscultation.  Breath sounds are equal.  Moving air well. Heart:  Regular rate and rhythm without murmurs, rubs, or gallops.  Abdomen:  Gravid. Nontender without masses or hepatosplenomegaly.  Bowel sounds are present.  No CVA or flank tenderness.   Extremities:  No edema.  Skin:  No rash present.   UC Treatments / Results  Labs (all labs ordered are listed, but only abnormal results are displayed) Labs Reviewed - No data to display  EKG   Radiology No results found.  Procedures Procedures (including critical care time)  Medications Ordered in UC Medications - No data to display  Initial Impression / Assessment and Plan / UC Course  I have reviewed the triage vital signs and the nursing notes.  Pertinent labs & imaging results that were available during my care of the patient were reviewed by me and considered in my medical decision making (see chart for details).    There is no evidence of bacterial infection today.  Treat symptomatically for now  Followup with Family Doctor if not improved in about 10 days.   Final Clinical Impressions(s) / UC Diagnoses   Final diagnoses:  Viral URI  Nasal sinus congestion     Discharge Instructions        May use Afrin nasal spray (or generic oxymetazoline) each morning for about 5 days and then discontinue.  Also recommend using saline nasal spray several times daily and saline nasal irrigation (AYR is a common brand).  Use Flonase nasal spray each morning after using Afrin nasal spray and saline nasal irrigation.   Begin cephalexin if not improving in 7 to 10 days.    ED Prescriptions    Medication Sig Dispense Auth. Provider   cephALEXin (KEFLEX) 500 MG capsule Take 1 capsule (500 mg total) by mouth 3 (three) times daily. 21 capsule 08/11/2019, MD        Lattie Haw, MD 04/03/20 [phone]

## 2020-04-01 NOTE — Telephone Encounter (Signed)
Patient was seen in ED yesterday for her Symptoms and treated with antibiotics.

## 2020-04-02 ENCOUNTER — Other Ambulatory Visit: Payer: Self-pay

## 2020-04-02 ENCOUNTER — Ambulatory Visit (INDEPENDENT_AMBULATORY_CARE_PROVIDER_SITE_OTHER): Payer: BC Managed Care – PPO | Admitting: Student

## 2020-04-02 VITALS — BP 103/71 | HR 97 | Wt 133.7 lb

## 2020-04-02 DIAGNOSIS — Z3400 Encounter for supervision of normal first pregnancy, unspecified trimester: Secondary | ICD-10-CM

## 2020-04-02 DIAGNOSIS — Z3A33 33 weeks gestation of pregnancy: Secondary | ICD-10-CM

## 2020-04-02 NOTE — Progress Notes (Signed)
rot

## 2020-04-02 NOTE — Patient Instructions (Signed)
Considering Waterbirth? Guide for patients at [facility] Why consider waterbirth? . Gentle birth for babies  . Less pain medicine used in labor  . May allow for passive descent/less pushing  . May reduce perineal tears  . More mobility and instinctive maternal position changes  . Increased maternal relaxation  . Reduced blood pressure in labor   Is waterbirth safe? What are the risks of infection, drowning or other complications? . Infection:  . Very low risk (3.7 % for tub vs 4.8% for bed)  . 7 in 8000 waterbirths with documented infection  . Poorly cleaned equipment most common cause  . Slightly lower group B strep transmission rate  . Drowning  . Maternal:  . Very low risk  . Related to seizures or fainting  . Newborn:  . Very low risk. No evidence of increased risk of respiratory problems in multiple large studies  . Physiological protection from breathing under water  . Avoid underwater birth if there are any fetal complications  . Once baby's head is out of the water, keep it out.  . Birth complication  . Some reports of cord trauma, but risk decreased by bringing baby to surface gradually  . No evidence of increased risk of shoulder dystocia. Mothers can usually change positions faster in water than in a bed, possibly aiding the maneuvers to free the shoulder.  ? You must attend a Waterbirth class at [facility] [place] . 3rd Wednesday of every month from 7-9pm  . Free  . Register by calling [phone] or online at [email or web address]  . Bring us the certificate from the class to your prenatal appointment  Meet with a midwife at 36 weeks to see if you can still plan a waterbirth and to sign the consent.   If you plan a waterbirth at [facility] at , the following purchases are optional: . Fish Net . Bathing suit top  . Long-handled mirror  .  Things that would prevent you from having a  waterbirth: . Unknown or Positive COVID-19 diagnosis upon admission to hospital  . Premature, <37wks  . Previous cesarean birth  . Presence of thick meconium-stained fluid  . Multiple gestation (Twins, triplets, etc.)  . Uncontrolled diabetes or gestational diabetes requiring medication  . Hypertension diagnosed in pregnancy or preexisting hypertension (gestational hypertension, preeclampsia, or chronic hypertension) . Heavy vaginal bleeding  . Non-reassuring fetal heart rate  . Active infection (MRSA, etc.). Group B Strep is NOT a contraindication for waterbirth.  . If your labor has to be induced and induction method requires continuous monitoring of the baby's heart rate  . Other risks/issues identified by your obstetrical provider  .  Please remember that birth is unpredictable. Under certain unforeseeable circumstances your provider may advise against giving birth in the tub. These decisions will be made on a case-by-case basis and with the safety of you and your baby as our highest priority.  **Please remember that in order to have a waterbirth, you must test Negative to COVID-19 upon admission to the hospital.**  

## 2020-04-02 NOTE — Progress Notes (Signed)
Patient ID: Ashley Greene, female   DOB: [birth year], 29 y.o.   MRN: [id number]   PRENATAL VISIT NOTE  Subjective:  Deven Audi is a 29 y.o. G1P0 at [redacted]w[redacted]d being seen today for ongoing prenatal care.  She is currently monitored for the following issues for this low-risk pregnancy and has Supervision of normal first pregnancy, antepartum; HSV-2 infection; Rh negative state in antepartum period; and Rubella immune on their problem list.  Patient reports no complaints. Many questions about waterbirth, when to come to the hospital, has not picked out pediatrician. Is interested in knowing if she needs a birth plan and interested in alternatives for epidural in labor.   Contractions: Not present.  .  Movement: Present. Denies leaking of fluid.   The following portions of the patient's history were reviewed and updated as appropriate: allergies, current medications, past family history, past medical history, past social history, past surgical history and problem list.   Objective:   Vitals:   04/02/20 0934  BP: 103/71  Pulse: 97  Weight: 133 lb 11.2 oz (60.6 kg)    Fetal Status: Fetal Heart Rate (bpm): 144 Fundal Height: 33 cm Movement: Present     General:  Alert, oriented and cooperative. Patient is in no acute distress.  Skin: Skin is warm and dry. No rash noted.   Cardiovascular: Normal heart rate noted  Respiratory: Normal respiratory effort, no problems with respiration noted  Abdomen: Soft, gravid, appropriate for gestational age.  Pain/Pressure: Absent     Pelvic: Cervical exam deferred        Extremities: Normal range of motion.  Edema: None  Mental Status: Normal mood and affect. Normal behavior. Normal judgment and thought content.   Assessment and Plan:  Pregnancy: G1P0 at [redacted]w[redacted]d   1. Supervision of normal first pregnancy, antepartum   2. [redacted] weeks gestation of pregnancy   -Scheduled for WB class next week; reviewed restrictions on WB, was very clear with patient about how  fetal or maternal status can change eligibility of WB -Discussed alternatives for epidural, nitrous, IV, discussed birthing positions and staying in shape in preparation for labor -Discussed birth plan, patient can review online versions and bring in two weeks. Reassured patient that, regardless of birth plan, we aim to honor her wishes for her care during labor, delivery and PP.   Preterm labor symptoms and general obstetric precautions including but not limited to vaginal bleeding, contractions, leaking of fluid and fetal movement were reviewed in detail with the patient. Please refer to After Visit Summary for other counseling recommendations.   Return in about 2 weeks (around 04/16/2020), or LROB in person with KK.  Future Appointments  Date Time Provider Department Center  04/24/2020  9:15 AM Marylene Land, CNM [facility]    [address] [place], [place]

## 2020-04-10 DIAGNOSIS — Z20822 Contact with and (suspected) exposure to covid-19: Secondary | ICD-10-CM | POA: Diagnosis not present

## 2020-04-24 ENCOUNTER — Other Ambulatory Visit (HOSPITAL_COMMUNITY)
Admission: RE | Admit: 2020-04-24 | Discharge: 2020-04-24 | Disposition: A | Discharge status: Home / Self Care | Payer: BC Managed Care – PPO | Source: Ambulatory Visit | Attending: Student | Admitting: Student

## 2020-04-24 ENCOUNTER — Ambulatory Visit (INDEPENDENT_AMBULATORY_CARE_PROVIDER_SITE_OTHER): Payer: BC Managed Care – PPO | Admitting: Student

## 2020-04-24 ENCOUNTER — Other Ambulatory Visit: Payer: Self-pay

## 2020-04-24 VITALS — BP 104/68 | HR 96 | Wt 138.0 lb

## 2020-04-24 DIAGNOSIS — Z3A37 37 weeks gestation of pregnancy: Secondary | ICD-10-CM

## 2020-04-24 DIAGNOSIS — Z3400 Encounter for supervision of normal first pregnancy, unspecified trimester: Secondary | ICD-10-CM | POA: Diagnosis not present

## 2020-04-24 NOTE — Patient Instructions (Signed)
[email or web address]

## 2020-04-24 NOTE — Progress Notes (Signed)
   PRENATAL VISIT NOTE  Subjective:  Ashley Greene is a 29 y.o. G1P0 at [redacted]w[redacted]d being seen today for ongoing prenatal care.  She is currently monitored for the following issues for this low-risk pregnancy and has Supervision of normal first pregnancy, antepartum; HSV-2 infection; Rh negative state in antepartum period; and Rubella immune on their problem list.  Patient reports no complaints.  Contractions: Not present. Vag. Bleeding: None.  Movement: Present. Denies leaking of fluid.   The following portions of the patient's history were reviewed and updated as appropriate: allergies, current medications, past family history, past medical history, past social history, past surgical history and problem list.   Objective:   Vitals:   04/24/20 0949  BP: 104/68  Pulse: 96  Weight: 138 lb (62.6 kg)    Fetal Status: Fetal Heart Rate (bpm): 148 Fundal Height: 37 cm Movement: Present  Presentation: Vertex  General:  Alert, oriented and cooperative. Patient is in no acute distress.  Skin: Skin is warm and dry. No rash noted.   Cardiovascular: Normal heart rate noted  Respiratory: Normal respiratory effort, no problems with respiration noted  Abdomen: Soft, gravid, appropriate for gestational age.  Pain/Pressure: Absent     Pelvic: Cervical exam deferred        Extremities: Normal range of motion.  Edema: None  Mental Status: Normal mood and affect. Normal behavior. Normal judgment and thought content.   Assessment and Plan:  Pregnancy: G1P0 at [redacted]w[redacted]d 1. Supervision of normal first pregnancy, antepartum -taken water birth class; will email me the certificate -signed WB consent today - Culture, beta strep (group b only) - GC/Chlamydia probe amp ([facility])not at [facility] -acyclovir 3 times a day Preterm labor symptoms and general obstetric precautions including but not limited to vaginal bleeding, contractions, leaking of fluid and fetal movement were reviewed in detail with the  patient. Please refer to After Visit Summary for other counseling recommendations.   Return in about 2 weeks (around 05/08/2020), or LROB with KK.  No future appointments.  Marylene Land, CNM

## 2020-04-27 ENCOUNTER — Telehealth: Payer: Self-pay

## 2020-04-27 ENCOUNTER — Encounter: Payer: Self-pay | Admitting: Student

## 2020-04-27 DIAGNOSIS — B951 Streptococcus, group B, as the cause of diseases classified elsewhere: Secondary | ICD-10-CM | POA: Insufficient documentation

## 2020-04-27 HISTORY — DX: Streptococcus, group b, as the cause of diseases classified elsewhere: B95.1

## 2020-04-27 LAB — GC/CHLAMYDIA PROBE AMP (~~LOC~~) NOT AT ARMC
Chlamydia: NEGATIVE
Comment: NEGATIVE
Comment: NORMAL
Neisseria Gonorrhea: NEGATIVE

## 2020-04-27 LAB — CULTURE, BETA STREP (GROUP B ONLY): Strep Gp B Culture: POSITIVE — AB

## 2020-04-27 NOTE — Telephone Encounter (Signed)
Pt called and wanted to more information about her testing positive for GBS and does it prevent her from a waterbirth.  Informed pt that it does not prevent her from having a waterbirth.  Pt asked if there is an alternative to IV antibiotics.  I explained to the pt that there is no other alternative to the IV antibiotics for tx.  I also explained to the pt the importance of receiving the tx.  Pt verbalized understanding with no further questions.   Addison Naegeli, RN  04/27/20

## 2020-04-28 ENCOUNTER — Telehealth: Payer: Self-pay

## 2020-04-28 ENCOUNTER — Encounter: Payer: Self-pay | Admitting: General Practice

## 2020-04-28 NOTE — Telephone Encounter (Signed)
Called pt about her recent message via MyChart that she wants to speak with Susanne Borders, CNM with questions about her results.  Attempted to contact patient unable to leave message due to voicemail box is full.  Message routed to Luna Kitchens, CNM.     Addison Naegeli, RN

## 2020-04-29 ENCOUNTER — Other Ambulatory Visit: Payer: Self-pay | Admitting: Student

## 2020-04-29 DIAGNOSIS — B951 Streptococcus, group B, as the cause of diseases classified elsewhere: Secondary | ICD-10-CM

## 2020-04-30 ENCOUNTER — Ambulatory Visit: Payer: BC Managed Care – PPO

## 2020-05-01 ENCOUNTER — Ambulatory Visit (INDEPENDENT_AMBULATORY_CARE_PROVIDER_SITE_OTHER): Payer: BC Managed Care – PPO | Admitting: *Deleted

## 2020-05-01 ENCOUNTER — Other Ambulatory Visit: Payer: Self-pay

## 2020-05-01 VITALS — BP 108/69 | HR 96 | Ht 62.0 in | Wt 136.9 lb

## 2020-05-01 DIAGNOSIS — Z3400 Encounter for supervision of normal first pregnancy, unspecified trimester: Secondary | ICD-10-CM | POA: Diagnosis not present

## 2020-05-01 NOTE — Progress Notes (Signed)
Pt presents for repeat GBS swab due to need for sensitivity testing. Swab obtained by Dr. Vergie Living. She advised that she will be notified of test results and treatment indicated if any, via Mychart.  She voiced understanding of all information and instructions given. Next prenatal visit scheduled on 05/11/20.

## 2020-05-02 NOTE — Progress Notes (Signed)
Patient was assessed and managed by nursing staff during this encounter. I have reviewed the chart and agree with the documentation and plan. I have also made any necessary editorial changes.  Deandre Brannan, MD 05/02/2020 11:15 PM   

## 2020-05-04 ENCOUNTER — Other Ambulatory Visit (HOSPITAL_COMMUNITY)
Admission: RE | Admit: 2020-05-04 | Discharge: 2020-05-04 | Disposition: A | Discharge status: Home / Self Care | Payer: BC Managed Care – PPO | Source: Ambulatory Visit | Attending: Obstetrics and Gynecology | Admitting: Obstetrics and Gynecology

## 2020-05-04 ENCOUNTER — Encounter: Payer: Self-pay | Admitting: Obstetrics and Gynecology

## 2020-05-04 ENCOUNTER — Ambulatory Visit (INDEPENDENT_AMBULATORY_CARE_PROVIDER_SITE_OTHER): Payer: BC Managed Care – PPO | Admitting: Obstetrics and Gynecology

## 2020-05-04 ENCOUNTER — Other Ambulatory Visit: Payer: Self-pay

## 2020-05-04 ENCOUNTER — Other Ambulatory Visit: Payer: Self-pay | Admitting: Obstetrics and Gynecology

## 2020-05-04 VITALS — BP 117/80 | HR 93

## 2020-05-04 DIAGNOSIS — O26893 Other specified pregnancy related conditions, third trimester: Secondary | ICD-10-CM | POA: Diagnosis not present

## 2020-05-04 DIAGNOSIS — N898 Other specified noninflammatory disorders of vagina: Secondary | ICD-10-CM | POA: Insufficient documentation

## 2020-05-04 DIAGNOSIS — Z3400 Encounter for supervision of normal first pregnancy, unspecified trimester: Secondary | ICD-10-CM | POA: Diagnosis not present

## 2020-05-04 DIAGNOSIS — O26899 Other specified pregnancy related conditions, unspecified trimester: Secondary | ICD-10-CM

## 2020-05-04 DIAGNOSIS — Z6791 Unspecified blood type, Rh negative: Secondary | ICD-10-CM

## 2020-05-04 DIAGNOSIS — Z3A38 38 weeks gestation of pregnancy: Secondary | ICD-10-CM | POA: Diagnosis not present

## 2020-05-04 DIAGNOSIS — B009 Herpesviral infection, unspecified: Secondary | ICD-10-CM | POA: Diagnosis not present

## 2020-05-04 DIAGNOSIS — B951 Streptococcus, group B, as the cause of diseases classified elsewhere: Secondary | ICD-10-CM

## 2020-05-04 NOTE — Progress Notes (Signed)
   PRENATAL VISIT NOTE  Subjective:  Ashley Greene is a 29 y.o. G1P0 at [redacted]w[redacted]d being seen today for ongoing prenatal care.  She is currently monitored for the following issues for this low-risk pregnancy and has Supervision of normal first pregnancy, antepartum; HSV-2 infection; Rh negative state in antepartum period; Rubella immune; and Positive GBS test on their problem list.  Patient reports white vaginal discharge. Also with rash in genital area. Does not hurt or itch, does not feel like her usual HSV outbreak. Looks like a pityriasis rash she had on abdomen in April.  Contractions: Not present. Vag. Bleeding: None.  Movement: Present. Denies leaking of fluid.   The following portions of the patient's history were reviewed and updated as appropriate: allergies, current medications, past family history, past medical history, past social history, past surgical history and problem list.   Objective:   Vitals:   05/04/20 1444  BP: 117/80  Pulse: 93    Fetal Status: Fetal Heart Rate (bpm): 142   Movement: Present     General:  Alert, oriented and cooperative. Patient is in no acute distress.  Skin: Skin is warm and dry. No rash noted.   Cardiovascular: Normal heart rate noted  Respiratory: Normal respiratory effort, no problems with respiration noted  Abdomen: Soft, gravid, appropriate for gestational age.  Pain/Pressure: Absent     Pelvic: Cervical exam deferred       5 erythematous minutely raised papules spread across labia majora and right groin, no vesicles,   Extremities: Normal range of motion.  Edema: None  Mental Status: Normal mood and affect. Normal behavior. Normal judgment and thought content.   Assessment and Plan:  Pregnancy: G1P0 at [redacted]w[redacted]d  1. Supervision of normal first pregnancy, antepartum IOL already scheduled  2. HSV-2 infection Cont valtrex Groin rash does not appear vesicular, spots swabbed with HSV culture If positive, will need CS, patient aware  3. Rh  negative state in antepartum period Rho gam prn  4. Positive GBS test ppx in labor  Term labor symptoms and general obstetric precautions including but not limited to vaginal bleeding, contractions, leaking of fluid and fetal movement were reviewed in detail with the patient. Please refer to After Visit Summary for other counseling recommendations.   Return in about 1 week (around 05/11/2020) for low OB, in person.  Future Appointments  Date Time Provider Department Center  05/11/2020 10:35 AM Sharyon Cable, CNM [facility]    Conan Bowens, MD

## 2020-05-04 NOTE — Addendum Note (Signed)
Addended by: Maxwell Marion E on: 05/04/2020 04:59 PM   Modules accepted: Orders

## 2020-05-05 ENCOUNTER — Telehealth (HOSPITAL_COMMUNITY): Payer: Self-pay | Admitting: *Deleted

## 2020-05-05 ENCOUNTER — Telehealth: Payer: Self-pay

## 2020-05-05 ENCOUNTER — Encounter (HOSPITAL_COMMUNITY): Payer: Self-pay | Admitting: *Deleted

## 2020-05-05 LAB — CERVICOVAGINAL ANCILLARY ONLY
Bacterial Vaginitis (gardnerella): NEGATIVE
Candida Glabrata: NEGATIVE
Candida Vaginitis: NEGATIVE
Comment: NEGATIVE
Comment: NEGATIVE
Comment: NEGATIVE

## 2020-05-05 LAB — STREP GP B SUSCEPTIBILITY

## 2020-05-05 LAB — STREP GP B CULTURE+RFLX: Strep Gp B Culture+Rflx: POSITIVE — AB

## 2020-05-05 NOTE — Telephone Encounter (Signed)
Preadmission screen  

## 2020-05-05 NOTE — Telephone Encounter (Signed)
Pt call transferred from the front office.  Pt requests that her IOL appt be cancelled.  Pt stated that at her last visit she had informed the provider that she wanted it to be closer to her due date some how it is scheduled further out.

## 2020-05-06 ENCOUNTER — Telehealth: Payer: Self-pay | Admitting: Student

## 2020-05-06 ENCOUNTER — Encounter: Payer: Self-pay | Admitting: *Deleted

## 2020-05-06 LAB — HERPES SIMPLEX VIRUS CULTURE

## 2020-05-06 NOTE — Telephone Encounter (Signed)
Telephone call to patient. Patient has sent MyChart message with concerns about GBS status and vaginal flora and use of PCN in labor. She wants her daughter to receive vaginal flora during VD and is unsure about PCN. Moreover, she is concerned about  GBS strain is clinda resistant and will need vanc.   Extensive conversation about risks of GBS complications vs. Risks of abx in labor. Resources provided to research both risks of antibiotic use in labor and risks of untreated GBS (meningitis, pneumonia, sepsis).   -Discussed that patient *can* do PCN in labor because upset stomach is not a true PCN allergy but rather a side effect.   -Patient does not want IOL before 41 weeks; I reassured her that we can reschedule her IOL for 41 weeks as long as testing of baby is fine. Will let L and D know.   -Discussed that it would not be recommended to test for GBS using PCR, but that it can be an on-going discussion. She is free to refuse antibiotics; also recommended that patient consult with pediatrician if she is concerned about effects of antibiotics while in labor.   -Lab results of HSV-2 are pending; patient knows that C.section may be necessary if positive.   All questions answered.  Ashley Greene

## 2020-05-11 ENCOUNTER — Other Ambulatory Visit: Payer: Self-pay

## 2020-05-11 ENCOUNTER — Ambulatory Visit (INDEPENDENT_AMBULATORY_CARE_PROVIDER_SITE_OTHER): Payer: BC Managed Care – PPO | Admitting: Certified Nurse Midwife

## 2020-05-11 ENCOUNTER — Other Ambulatory Visit (HOSPITAL_COMMUNITY)
Admission: RE | Admit: 2020-05-11 | Discharge: 2020-05-11 | Disposition: A | Discharge status: Home / Self Care | Payer: BC Managed Care – PPO | Source: Ambulatory Visit | Attending: Certified Nurse Midwife | Admitting: Certified Nurse Midwife

## 2020-05-11 VITALS — BP 110/75 | HR 91 | Wt 138.7 lb

## 2020-05-11 DIAGNOSIS — Z3403 Encounter for supervision of normal first pregnancy, third trimester: Secondary | ICD-10-CM | POA: Diagnosis not present

## 2020-05-11 DIAGNOSIS — B009 Herpesviral infection, unspecified: Secondary | ICD-10-CM

## 2020-05-11 DIAGNOSIS — Z3400 Encounter for supervision of normal first pregnancy, unspecified trimester: Secondary | ICD-10-CM

## 2020-05-11 DIAGNOSIS — Z6791 Unspecified blood type, Rh negative: Secondary | ICD-10-CM

## 2020-05-11 DIAGNOSIS — Z3A39 39 weeks gestation of pregnancy: Secondary | ICD-10-CM

## 2020-05-11 DIAGNOSIS — O26899 Other specified pregnancy related conditions, unspecified trimester: Secondary | ICD-10-CM

## 2020-05-11 NOTE — Patient Instructions (Signed)
Reasons to go to MAU:  1.  Contractions are  5 minutes apart or less, each last 1 minute, these have been going on for 1-2 hours, and you cannot walk or talk during them 2.  You have a large gush of fluid, or a trickle of fluid that will not stop and you have to wear a pad 3.  You have bleeding that is bright red, heavier than spotting--like menstrual bleeding (spotting can be normal in early labor or after a check of your cervix) 4.  You do not feel the baby moving like he/she normally does   Cervical Ripening (to get your cervix ready for labor) : May try one or all:  Red Raspberry Leaf capsules:  two 300mg or 400mg tablets with each meal, 2-3 times a day  Potential Side Effects Of Raspberry Leaf:  Most women do not experience any side effects from drinking raspberry leaf tea. However, nausea and loose stools are possible   Evening Primrose Oil capsules: may take 1 to 3 capsules daily. May also prick one to release the oil and insert it into your vagina at night.  Some of the potential side effects:  Upset stomach  Loose stools or diarrhea  Headaches  Nausea   4 Dates a day (may taste better if warmed in microwave until soft). Found where raisins are in the grocery store  

## 2020-05-11 NOTE — Progress Notes (Signed)
   PRENATAL VISIT NOTE  Subjective:  Ashley Greene is a 29 y.o. G1P0 at [redacted]w[redacted]d being seen today for ongoing prenatal care.  She is currently monitored for the following issues for this low-risk pregnancy and has Supervision of normal first pregnancy, antepartum; HSV-2 infection; Rh negative state in antepartum period; Rubella immune; and Positive GBS test on their problem list.  Patient reports occasional contractions.  Contractions: Irritability. Vag. Bleeding: None.  Movement: Present. Denies leaking of fluid.   The following portions of the patient's history were reviewed and updated as appropriate: allergies, current medications, past family history, past medical history, past social history, past surgical history and problem list.   Objective:   Vitals:   05/11/20 1054  BP: 110/75  Pulse: 91  Weight: 138 lb 11.2 oz (62.9 kg)    Fetal Status: Fetal Heart Rate (bpm): 140   Movement: Present     General:  Alert, oriented and cooperative. Patient is in no acute distress.  Skin: Skin is warm and dry. No rash noted.   Cardiovascular: Normal heart rate noted  Respiratory: Normal respiratory effort, no problems with respiration noted  Abdomen: Soft, gravid, appropriate for gestational age.  Pain/Pressure: Present     Pelvic: Cervical exam deferred        Extremities: Normal range of motion.  Edema: None  Mental Status: Normal mood and affect. Normal behavior. Normal judgment and thought content.   Assessment and Plan:  Pregnancy: G1P0 at [redacted]w[redacted]d 1. Supervision of normal first pregnancy, antepartum - IOL scheduled for 05/22/2020 - Repeat Strep Gp B Culture+Rflx with Vancomycin during labor if positive. Culture sensitivity shows resistance to clindamycin.  - patient requested repeat GBS screening, discussed with patient there is unlikely to be a change in results   2. HSV-2 infection - Continue acyclovir. - HSV culture from 05/04/2020 negative - Groin rash not present today.  3. Rh  negative state in antepartum period - Rho gam prn  4. [redacted] weeks gestation of pregnancy  Term labor symptoms and general obstetric precautions including but not limited to vaginal bleeding, contractions, leaking of fluid and fetal movement were reviewed in detail with the patient. Please refer to After Visit Summary for other counseling recommendations.    Future Appointments  Date Time Provider Department Center  05/18/2020  9:45 AM [facility]ENING [facility] None  05/22/2020  7:30 AM [facility] SCHED ROOM [facility] None    Gigi Gin, Sandy

## 2020-05-12 LAB — CERVICOVAGINAL ANCILLARY ONLY
Bacterial Vaginitis (gardnerella): NEGATIVE
Candida Glabrata: NEGATIVE
Candida Vaginitis: NEGATIVE
Comment: NEGATIVE
Comment: NEGATIVE
Comment: NEGATIVE

## 2020-05-15 LAB — STREP GP B SUSCEPTIBILITY

## 2020-05-15 LAB — STREP GP B CULTURE+RFLX: Strep Gp B Culture+Rflx: POSITIVE — AB

## 2020-05-16 ENCOUNTER — Other Ambulatory Visit: Payer: Self-pay | Admitting: Advanced Practice Midwife

## 2020-05-18 ENCOUNTER — Other Ambulatory Visit (HOSPITAL_COMMUNITY): Payer: BC Managed Care – PPO | Attending: Family Medicine

## 2020-05-18 ENCOUNTER — Ambulatory Visit (INDEPENDENT_AMBULATORY_CARE_PROVIDER_SITE_OTHER): Payer: BC Managed Care – PPO | Admitting: *Deleted

## 2020-05-18 ENCOUNTER — Ambulatory Visit: Payer: Self-pay

## 2020-05-18 ENCOUNTER — Ambulatory Visit (INDEPENDENT_AMBULATORY_CARE_PROVIDER_SITE_OTHER): Payer: BC Managed Care – PPO | Admitting: Advanced Practice Midwife

## 2020-05-18 ENCOUNTER — Other Ambulatory Visit: Payer: Self-pay

## 2020-05-18 VITALS — BP 116/81 | HR 96 | Wt 141.0 lb

## 2020-05-18 DIAGNOSIS — Z3A40 40 weeks gestation of pregnancy: Secondary | ICD-10-CM

## 2020-05-18 DIAGNOSIS — O480 Post-term pregnancy: Secondary | ICD-10-CM | POA: Diagnosis not present

## 2020-05-18 DIAGNOSIS — Z3400 Encounter for supervision of normal first pregnancy, unspecified trimester: Secondary | ICD-10-CM

## 2020-05-18 DIAGNOSIS — B951 Streptococcus, group B, as the cause of diseases classified elsewhere: Secondary | ICD-10-CM

## 2020-05-18 NOTE — Progress Notes (Signed)
IOL scheduled 12/3 in am

## 2020-05-18 NOTE — Progress Notes (Signed)

## 2020-05-18 NOTE — Progress Notes (Signed)
   PRENATAL VISIT NOTE  Subjective:  Ashley Greene is a 29 y.o. G1P0 at [redacted]w[redacted]d being seen today for ongoing prenatal care.  She is currently monitored for the following issues for this low-risk pregnancy and has Supervision of normal first pregnancy, antepartum; HSV-2 infection; Rh negative state in antepartum period; Rubella immune; and Positive GBS test on their problem list.  Patient reports no complaints.  Contractions: Not present. Vag. Bleeding: None.  Movement: Present. Denies leaking of fluid.   The following portions of the patient's history were reviewed and updated as appropriate: allergies, current medications, past family history, past medical history, past social history, past surgical history and problem list. Problem list updated.  Objective:   Vitals:   05/18/20 1458  BP: 116/81  Pulse: 96  Weight: 141 lb (64 kg)    Fetal Status: Fetal Heart Rate (bpm): NST   Movement: Present  Presentation: Vertex  General:  Alert, oriented and cooperative. Patient is in no acute distress.  Skin: Skin is warm and dry. No rash noted.   Cardiovascular: Normal heart rate noted  Respiratory: Normal respiratory effort, no problems with respiration noted  Abdomen: Soft, gravid, appropriate for gestational age.  Pain/Pressure: Present     Pelvic: Cervical exam deferred, declined by patient        Extremities: Normal range of motion.  Edema: None  Mental Status: Normal mood and affect. Normal behavior. Normal judgment and thought content.   Assessment and Plan:  Pregnancy: G1P0 at [redacted]w[redacted]d  1. Supervision of normal first pregnancy, antepartum - Reactive NST in office today - PDIOL 05/22/2020 - Confirmed location of MAU for labor triage, SROM,   2. [redacted] weeks gestation of pregnancy   3. Positive GBS test - Ongoing discussion of patient's wishes in labor  Term labor symptoms and general obstetric precautions including but not limited to vaginal bleeding, contractions, leaking of fluid  and fetal movement were reviewed in detail with the patient. Please refer to After Visit Summary for other counseling recommendations.  Return for IOL on 12/3.  Future Appointments  Date Time Provider Department Center  05/22/2020  7:30 AM [facility] SCHED ROOM [facility] None    Calvert Cantor, CNM

## 2020-05-18 NOTE — Patient Instructions (Signed)
Balloon Catheter Placement for Cervical Ripening Balloon catheter placement for cervical ripening is a procedure to help your cervix start to soften (ripen) and open (dilate). It is done to prepare your body for labor induction. During this procedure, a thin tube (catheter) is placed through your cervix. A tiny balloon attached to the catheter is inflated with water. Pressure from the balloon is what helps your cervix start to open. Cervical ripening with a balloon catheter can make labor induction shorter and easier. You may have this procedure if:  Your cervix is not ready for labor.  Your health care provider has planned labor induction.  You are not having twins or multiples.  Your baby is in the head-down position.  You do not have any other pregnancy complications that require you to be monitored in the hospital after balloon catheter placement. If your health care provider has recommended labor induction to stimulate a vaginal birth, this procedure may be started the day before induction. You will go home with the balloon in place and return to start induction in 12-24 hours. You may have this procedure and stay in the hospital so that your progress can be monitored as well. Tell a health care provider about:  Any allergies you have.  All medicines you are taking, including vitamins, herbs, eye drops, creams, and over-the-counter medicines.  Any blood disorders you have.  Any surgeries you have had.  Any medical conditions you have. What are the risks? Generally, this is a safe procedure. However, problems may occur, including:  Infection.  Bleeding.  Cramping or pain.  Difficulty passing urine.  The baby moving from the head-down position to a position with the feet or buttocks down (breech position). What happens before the procedure?  Your health care provider may check your baby's heartbeat (fetal monitoring) before the procedure.  You may be asked to empty your  bladder. What happens during the procedure?   You will be positioned on the exam table as if you were having a pelvic exam or Pap test.  Your health care provider may insert a medical instrument into your vagina (speculum) to see your cervix.  Your cervix may be cleaned with a germ-killing solution.  The catheter will be inserted through the opening of your cervix.  A balloon on the end of the catheter will be inflated with sterile water. Some catheters have two balloons, one on each side of the cervix.  Depending on the type of balloon catheter, the end of the catheter may be left free outside your cervix or taped to your leg. The procedure may vary among health care providers and hospitals. What can I expect after the procedure?  After the procedure, it is common to have: ? A feeling of pressure inside your vagina. ? Light vaginal bleeding (spotting).  You may have fetal monitoring before going home.  You may be sent home with the catheter in place and asked to return to start your induction in about 12-24 hours. Follow these instructions at home:  Take over-the-counter and prescription medicines only as told by your health care provider.  Return to your normal activities at home as told by your health care provider. Ask your health care provider what activities are safe for you. Do not leave home until you return for labor induction.  You may shower at home. Do not take baths, swim, or use a hot tub unless your health care provider approves.  As your cervix opens, your catheter and balloon may   fall out before you return for labor induction. Ask your health care provider what you should do if this happens.  Keep all follow-up visits as told by your health care provider. This is important. You will need to return to start induction as told by your health care provider. Contact your health care provider if:  You have chills or a fever.  You have constant pain or cramps (not  contractions).  You have trouble passing urine.  Your water breaks.  You have vaginal bleeding that is heavier than spotting.  You have contractions that start to last longer and come closer together (about every 5 minutes).  The balloon catheter falls out before you return to start your induction. Summary  Cervical ripening with placement of a balloon catheter is an outpatient procedure to prepare you for labor induction.  Cervical ripening with a balloon catheter helps your cervix start to open for birth.  You will be positioned on the exam table. The catheter will be inserted through the opening of your cervix. A balloon on the end of the catheter will be inflated with water.  Pressure from the balloon will cause ripening of your cervix. You will go home with the balloon in place and return to start induction in 12-24 hours.  Contact your health care provider if you have pain, fever, vaginal bleeding, or trouble passing urine. Also contact him or her if your water breaks, you start to go into labor, or your balloon catheter falls out before you return to start your induction. This information is not intended to replace advice given to you by your health care provider. Make sure you discuss any questions you have with your health care provider. Document Revised: 02/02/2018 Document Reviewed: 02/02/2018 Elsevier Patient Education  2020 [employer]  

## 2020-05-19 ENCOUNTER — Inpatient Hospital Stay (HOSPITAL_COMMUNITY): Payer: BC Managed Care – PPO | Admitting: Anesthesiology

## 2020-05-19 ENCOUNTER — Inpatient Hospital Stay (INDEPENDENT_AMBULATORY_CARE_PROVIDER_SITE_OTHER)
Admission: AD | Admit: 2020-05-19 | Discharge: 2020-05-19 | Disposition: A | Discharge status: Home / Self Care | Payer: BC Managed Care – PPO | Source: Home / Self Care | Attending: Obstetrics & Gynecology | Admitting: Obstetrics & Gynecology

## 2020-05-19 ENCOUNTER — Inpatient Hospital Stay (HOSPITAL_COMMUNITY)
Admission: AD | Admit: 2020-05-19 | Discharge: 2020-05-21 | DRG: 806 | Disposition: A | Discharge status: Home / Self Care | Payer: BC Managed Care – PPO | Attending: Obstetrics and Gynecology | Admitting: Obstetrics and Gynecology

## 2020-05-19 ENCOUNTER — Encounter (HOSPITAL_COMMUNITY): Payer: Self-pay | Admitting: Family Medicine

## 2020-05-19 ENCOUNTER — Encounter (HOSPITAL_COMMUNITY): Payer: Self-pay | Admitting: Obstetrics & Gynecology

## 2020-05-19 ENCOUNTER — Other Ambulatory Visit: Payer: Self-pay

## 2020-05-19 ENCOUNTER — Inpatient Hospital Stay (HOSPITAL_COMMUNITY): Payer: BC Managed Care – PPO

## 2020-05-19 ENCOUNTER — Inpatient Hospital Stay (HOSPITAL_COMMUNITY)
Admission: AD | Admit: 2020-05-19 | Payer: BC Managed Care – PPO | Source: Home / Self Care | Admitting: Obstetrics & Gynecology

## 2020-05-19 DIAGNOSIS — B009 Herpesviral infection, unspecified: Secondary | ICD-10-CM | POA: Diagnosis not present

## 2020-05-19 DIAGNOSIS — A6000 Herpesviral infection of urogenital system, unspecified: Secondary | ICD-10-CM | POA: Diagnosis present

## 2020-05-19 DIAGNOSIS — O480 Post-term pregnancy: Secondary | ICD-10-CM | POA: Diagnosis present

## 2020-05-19 DIAGNOSIS — Z6791 Unspecified blood type, Rh negative: Secondary | ICD-10-CM | POA: Diagnosis not present

## 2020-05-19 DIAGNOSIS — Z3A40 40 weeks gestation of pregnancy: Secondary | ICD-10-CM | POA: Diagnosis not present

## 2020-05-19 DIAGNOSIS — Z20822 Contact with and (suspected) exposure to covid-19: Secondary | ICD-10-CM | POA: Diagnosis not present

## 2020-05-19 DIAGNOSIS — O26893 Other specified pregnancy related conditions, third trimester: Secondary | ICD-10-CM | POA: Diagnosis not present

## 2020-05-19 DIAGNOSIS — O99820 Streptococcus B carrier state complicating pregnancy: Secondary | ICD-10-CM | POA: Diagnosis not present

## 2020-05-19 DIAGNOSIS — O99824 Streptococcus B carrier state complicating childbirth: Secondary | ICD-10-CM | POA: Diagnosis present

## 2020-05-19 DIAGNOSIS — O26899 Other specified pregnancy related conditions, unspecified trimester: Secondary | ICD-10-CM

## 2020-05-19 DIAGNOSIS — B951 Streptococcus, group B, as the cause of diseases classified elsewhere: Secondary | ICD-10-CM | POA: Diagnosis not present

## 2020-05-19 DIAGNOSIS — Z3400 Encounter for supervision of normal first pregnancy, unspecified trimester: Secondary | ICD-10-CM | POA: Diagnosis not present

## 2020-05-19 DIAGNOSIS — Z789 Other specified health status: Secondary | ICD-10-CM | POA: Diagnosis present

## 2020-05-19 DIAGNOSIS — O9832 Other infections with a predominantly sexual mode of transmission complicating childbirth: Secondary | ICD-10-CM | POA: Diagnosis present

## 2020-05-19 DIAGNOSIS — Z20818 Contact with and (suspected) exposure to other bacterial communicable diseases: Secondary | ICD-10-CM | POA: Diagnosis not present

## 2020-05-19 DIAGNOSIS — Q381 Ankyloglossia: Secondary | ICD-10-CM | POA: Diagnosis not present

## 2020-05-19 DIAGNOSIS — Z23 Encounter for immunization: Secondary | ICD-10-CM | POA: Diagnosis not present

## 2020-05-19 HISTORY — DX: Post-term pregnancy: O48.0

## 2020-05-19 LAB — TYPE AND SCREEN
ABO/RH(D): O NEG
Antibody Screen: POSITIVE

## 2020-05-19 LAB — RESP PANEL BY RT-PCR (FLU A&B, COVID) ARPGX2
Influenza A by PCR: NEGATIVE
Influenza B by PCR: NEGATIVE
SARS Coronavirus 2 by RT PCR: NEGATIVE

## 2020-05-19 LAB — CBC
HCT: 37 % (ref 36.0–46.0)
Hemoglobin: 12 g/dL (ref 12.0–15.0)
MCH: 29.6 pg (ref 26.0–34.0)
MCHC: 32.4 g/dL (ref 30.0–36.0)
MCV: 91.4 fL (ref 80.0–100.0)
Platelets: 356 10*3/uL (ref 150–400)
RBC: 4.05 MIL/uL (ref 3.87–5.11)
RDW: 12.8 % (ref 11.5–15.5)
WBC: 18 10*3/uL — ABNORMAL HIGH (ref 4.0–10.5)
nRBC: 0 % (ref 0.0–0.2)

## 2020-05-19 MED ORDER — LIDOCAINE HCL (PF) 1 % IJ SOLN: 30.0000 mL | INTRAMUSCULAR | Status: DC | PRN

## 2020-05-19 MED ORDER — CEFAZOLIN SODIUM-DEXTROSE 1-4 GM/50ML-% IV SOLN
1.0000 g | Freq: Three times a day (TID) | INTRAVENOUS | Status: DC
  Filled 2020-05-19 (×3): qty 50

## 2020-05-19 MED ORDER — ACETAMINOPHEN 325 MG PO TABS: 650.0000 mg | ORAL_TABLET | ORAL | Status: DC | PRN

## 2020-05-19 MED ORDER — DIPHENHYDRAMINE HCL 50 MG/ML IJ SOLN: 12.5000 mg | INTRAMUSCULAR | Status: DC | PRN

## 2020-05-19 MED ORDER — LACTATED RINGERS IV SOLN: 500.0000 mL | Freq: Once | INTRAVENOUS | Status: DC

## 2020-05-19 MED ORDER — OXYCODONE-ACETAMINOPHEN 5-325 MG PO TABS: 2.0000 | ORAL_TABLET | ORAL | Status: DC | PRN

## 2020-05-19 MED ORDER — OXYCODONE-ACETAMINOPHEN 5-325 MG PO TABS: 1.0000 | ORAL_TABLET | ORAL | Status: DC | PRN

## 2020-05-19 MED ORDER — EPHEDRINE 5 MG/ML INJ: 10.0000 mg | INTRAVENOUS | Status: DC | PRN

## 2020-05-19 MED ORDER — MISOPROSTOL 50MCG HALF TABLET: 50.0000 ug | ORAL_TABLET | ORAL | Status: DC | PRN

## 2020-05-19 MED ORDER — VANCOMYCIN HCL IN DEXTROSE 1-5 GM/200ML-% IV SOLN: 1000.0000 mg | Freq: Two times a day (BID) | INTRAVENOUS | Status: DC

## 2020-05-19 MED ORDER — FENTANYL-BUPIVACAINE-NACL 0.5-0.125-0.9 MG/250ML-% EP SOLN
12.0000 mL/h | EPIDURAL | Status: DC | PRN
  Filled 2020-05-19: qty 250

## 2020-05-19 MED ORDER — OXYTOCIN-SODIUM CHLORIDE 30-0.9 UT/500ML-% IV SOLN
2.5000 [IU]/h | INTRAVENOUS | Status: DC
  Filled 2020-05-19: qty 500

## 2020-05-19 MED ORDER — CEFAZOLIN SODIUM-DEXTROSE 2-4 GM/100ML-% IV SOLN: 2.0000 g | Freq: Once | INTRAVENOUS | Status: DC

## 2020-05-19 MED ORDER — LACTATED RINGERS IV SOLN: 500.0000 mL | INTRAVENOUS | Status: DC | PRN

## 2020-05-19 MED ORDER — ONDANSETRON HCL 4 MG/2ML IJ SOLN
4.0000 mg | Freq: Four times a day (QID) | INTRAMUSCULAR | Status: DC | PRN
  Administered 2020-05-20: 4 mg via INTRAVENOUS
  Filled 2020-05-19: qty 2

## 2020-05-19 MED ORDER — OXYTOCIN BOLUS FROM INFUSION
333.0000 mL | Freq: Once | INTRAVENOUS | Status: AC
  Administered 2020-05-20: 333 mL via INTRAVENOUS

## 2020-05-19 MED ORDER — PHENYLEPHRINE 40 MCG/ML (10ML) SYRINGE FOR IV PUSH (FOR BLOOD PRESSURE SUPPORT)
80.0000 ug | PREFILLED_SYRINGE | INTRAVENOUS | Status: DC | PRN
  Filled 2020-05-19: qty 10

## 2020-05-19 MED ORDER — PHENYLEPHRINE 40 MCG/ML (10ML) SYRINGE FOR IV PUSH (FOR BLOOD PRESSURE SUPPORT): 80.0000 ug | PREFILLED_SYRINGE | INTRAVENOUS | Status: DC | PRN

## 2020-05-19 MED ORDER — LIDOCAINE HCL (PF) 1 % IJ SOLN
INTRAMUSCULAR | Status: DC | PRN
  Administered 2020-05-19: 2 mL via EPIDURAL
  Administered 2020-05-19: 10 mL via EPIDURAL

## 2020-05-19 MED ORDER — SODIUM CHLORIDE (PF) 0.9 % IJ SOLN
INTRAMUSCULAR | Status: DC | PRN
  Administered 2020-05-19: 12 mL/h via EPIDURAL

## 2020-05-19 MED ORDER — TERBUTALINE SULFATE 1 MG/ML IJ SOLN: 0.2500 mg | Freq: Once | INTRAMUSCULAR | Status: DC | PRN

## 2020-05-19 MED ORDER — SOD CITRATE-CITRIC ACID 500-334 MG/5ML PO SOLN
30.0000 mL | ORAL | Status: DC | PRN
  Administered 2020-05-20: 30 mL via ORAL
  Filled 2020-05-19: qty 15

## 2020-05-19 MED ORDER — FENTANYL CITRATE (PF) 100 MCG/2ML IJ SOLN: 100.0000 ug | INTRAMUSCULAR | Status: DC | PRN

## 2020-05-19 MED ORDER — LACTATED RINGERS IV SOLN: INTRAVENOUS | Status: DC

## 2020-05-19 NOTE — Anesthesia Preprocedure Evaluation (Signed)
Anesthesia Evaluation  Patient identified by MRN, date of birth, ID band Patient awake    Reviewed: Allergy & Precautions, Patient's Chart, lab work & pertinent test results  Airway Mallampati: II  TM Distance: >3 FB Neck ROM: Full    Dental no notable dental hx.    Pulmonary neg pulmonary ROS,    Pulmonary exam normal breath sounds clear to auscultation       Cardiovascular negative cardio ROS Normal cardiovascular exam Rhythm:Regular Rate:Normal     Neuro/Psych negative neurological ROS  negative psych ROS   GI/Hepatic negative GI ROS, Neg liver ROS,   Endo/Other  negative endocrine ROS  Renal/GU negative Renal ROS  negative genitourinary   Musculoskeletal negative musculoskeletal ROS (+)   Abdominal   Peds negative pediatric ROS (+)  Hematology negative hematology ROS (+) hct 37, plt 356   Anesthesia Other Findings   Reproductive/Obstetrics (+) Pregnancy                             Anesthesia Physical Anesthesia Plan  ASA: II and emergent  Anesthesia Plan: Epidural   Post-op Pain Management:    Induction:   PONV Risk Score and Plan: 2  Airway Management Planned: Natural Airway  Additional Equipment: None  Intra-op Plan:   Post-operative Plan:   Informed Consent: I have reviewed the patients History and Physical, chart, labs and discussed the procedure including the risks, benefits and alternatives for the proposed anesthesia with the patient or authorized representative who has indicated his/her understanding and acceptance.       Plan Discussed with:   Anesthesia Plan Comments:         Anesthesia Quick Evaluation

## 2020-05-19 NOTE — H&P (Addendum)
OBSTETRIC ADMISSION HISTORY AND PHYSICAL  Ashley Greene is a 29 y.o. female G1P0 with IUP at [redacted]w[redacted]d by LMP presenting for SOL. She reports +FMs, No LOF, no VB, no blurry vision, headaches or peripheral edema, and RUQ pain. She desires water birth, but has decided to get an epidural instead. She plans on breast feeding. She requests nothing for birth control. She received her prenatal care at [facility]  Dating: By LMP --->  Estimated Date of Delivery: 05/15/20  Sono:    @[redacted]w[redacted]d , CWD, normal anatomy, breech presentation, posterior placenta, 345g, 85% EFW   Prenatal History/Complications:  - hx HSV-2 Infection (on acyclovir) - GBS positive (declines abx ppx)  Past Medical History: Past Medical History:  Diagnosis Date  . Medical history non-contributory     Past Surgical History: Past Surgical History:  Procedure Laterality Date  . WISDOM TOOTH EXTRACTION      Obstetrical History: OB History     Gravida  1   Para      Term      Preterm      AB      Living         SAB      TAB      Ectopic      Multiple      Live Births              Social History Social History   Socioeconomic History  . Marital status: Single    Spouse name: Not on file  . Number of children: Not on file  . Years of education: Not on file  . Highest education level: Not on file  Occupational History  . Not on file  Tobacco Use  . Smoking status: Never Smoker  . Smokeless tobacco: Never Used  Vaping Use  . Vaping Use: Never used  Substance and Sexual Activity  . Alcohol use: No  . Drug use: No  . Sexual activity: Yes    Birth control/protection: None  Other Topics Concern  . Not on file  Social History Narrative  . Not on file   Social Determinants of Health   Financial Resource Strain:   . Difficulty of Paying Living Expenses: Not on file  Food Insecurity: No Food Insecurity  . Worried About in the Last Year: Never true  . Ran Out of Food in  the Last Year: Never true  Transportation Needs: No Transportation Needs  . Lack of Transportation (Medical): No  . Lack of Transportation (Non-Medical): No  Physical Activity:   . Days of Exercise per Week: Not on file  . Minutes of Exercise per Session: Not on file  Stress:   . Feeling of Stress : Not on file  Social Connections:   . Frequency of Communication with Friends and Family: Not on file  . Frequency of Social Gatherings with Friends and Family: Not on file  . Attends Religious Services: Not on file  . Active Member of Clubs or Organizations: Not on file  . Attends Programme researcher, broadcasting/film/video Meetings: Not on file  . Marital Status: Not on file    Family History: Family History  Problem Relation Age of Onset  . Healthy Mother   . Healthy Father     Allergies: Allergies  Allergen Reactions  . Amoxicillin Diarrhea    Stomach upset   . Valtrex [Valacyclovir]     Stomach upset     Medications Prior to Admission  Medication Sig Dispense Refill Last  Dose  . acyclovir (ZOVIRAX) 400 MG tablet Take 400 mg by mouth 2 (two) times daily.      . Prenatal Vit-Fe Fumarate-FA (PRENATAL VITAMINS PO) Take by mouth.        Review of Systems   All systems reviewed and negative except as stated in HPI  Blood pressure 126/80, pulse 90, temperature 98.3 F (36.8 C), temperature source Oral, resp. rate 18, height 5\' 2"  (1.575 m), weight 63 kg, last menstrual period 08/09/2019, SpO2 100 %. General appearance: alert, appears stated age and no distress Chest: normal respiratory effort Pelvic: see below Extremities: no sign of DVT Presentation: cephalic by 08/11/2019 05/18/2020 Fetal monitoringBaseline: 135 bpm, Variability: Good {> 6 bpm), Accelerations: Reactive and Decelerations: Absent Uterine activity Frequency: every 2-4 minutes Dilation: 3.5 Effacement (%): 90 Station: -1, -2 Exam by:: [email or web address] CNM  Prenatal labs: ABO, Rh: O/Negative/-- (05/18 1450) Antibody: Negative (09/07  0906) Rubella: 7.06 (05/18 1450) RPR: Non Reactive (09/07 0906)  HBsAg: Negative (05/18 1450)  HIV: Non Reactive (09/07 0906)  GBS: Positive/-- (11/22 1423) , pt declines antibiotics 1 hr Glucola normal, 130 Genetic screening: normal Anatomy [birth year]2002 normal  Prenatal Transfer Tool  Maternal Diabetes: No Genetic Screening: Normal Maternal Ultrasounds/Referrals: Normal Fetal Ultrasounds or other Referrals:  None Maternal Substance Abuse:  No Significant Maternal Medications:  None Significant Maternal Lab Results: Group B Strep positive, pt declines antibiotics  No results found for this or any previous visit (from the past 24 hour(s)).  Patient Active Problem List   Diagnosis Date Noted  . Post term pregnancy over 40 weeks 05/19/2020  . Positive GBS test 04/27/2020  . Rh negative state in antepartum period 11/11/2019  . Rubella immune 11/11/2019  . Supervision of normal first pregnancy, antepartum 10/15/2019  . 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    Assessment/Plan:  Ashley Greene is a 29 y.o. G1P0 at [redacted]w[redacted]d here for SOL.    #1 SOL #2 hx HSV-2 Infection (none currently; taking acyclovir) #3 GBS+  Plan:  - Patient informed about the risk of the baby developing meningitis at delivery without antibiotic treatment for GBS. Patient denies antibiotics at this time. Patient informed she could change her mind at any time.   #Labor: Patient is feeling contractions regularly. Blood drawn at admission and patient is being prepped for epidural placement. Consider starting pitocin when appropriate.  #Pain: Planning on epidural placement #FWB: Cat 1 strip #ID: hx HSV-2 infection, pt taking Acyclovir; GBS + declines abx ppx (counseled) #MOF: breast #MOC: none  [redacted]w[redacted]d, Student-PA  05/19/2020, 4:58 PM  I was personally present and re-performed the exam and MDM and verified the service and findings are accurately documented in the student's note. Simone Autry-Lott, DO 05/19/20 6:28  PM  CNM note:  I have seen and examined this patient; I agree with above documentation in the resident's note.   [name] is a 29 y.o. G1P0 here for latent labor  PE: BP 119/71   Pulse (!) 101   Temp 98.3 F (36.8 C) (Oral)   Resp 18   Ht 5\' 2"  (1.575 m)   Wt 63 kg   LMP 08/09/2019 (Exact Date)   SpO2 100%   BMI 25.42 kg/m  Resp: normal effort, no distress Abd: gravid Pelvic: spec exam shows no s/s HSV lesions although visualization was obscured a little from blood (per Dr )  ROS, labs, PMH reviewed  Plan: Admit to Labor and Delivery Expectant management to start, may need Pit/AROM at some point Has received  thorough counseling re GBS ppx and continues to decline Anticipate vag del  Arabella Merles CNM 05/19/2020, 8:00 PM

## 2020-05-19 NOTE — Discharge Instructions (Signed)
Fetal Movement Counts Patient Name: ________________________________________________ Patient Due Date: ____________________ What is a fetal movement count?  A fetal movement count is the number of times that you feel your baby move during a certain amount of time. This may also be called a fetal kick count. A fetal movement count is recommended for every pregnant woman. You may be asked to start counting fetal movements as early as week 28 of your pregnancy. Pay attention to when your baby is most active. You may notice your baby's sleep and wake cycles. You may also notice things that make your baby move more. You should do a fetal movement count:  When your baby is normally most active.  At the same time each day. A good time to count movements is while you are resting, after having something to eat and drink. How do I count fetal movements? 1. Find a quiet, comfortable area. Sit, or lie down on your side. 2. Write down the date, the start time and stop time, and the number of movements that you felt between those two times. Take this information with you to your health care visits. 3. Write down your start time when you feel the first movement. 4. Count kicks, flutters, swishes, rolls, and jabs. You should feel at least 10 movements. 5. You may stop counting after you have felt 10 movements, or if you have been counting for 2 hours. Write down the stop time. 6. If you do not feel 10 movements in 2 hours, contact your health care provider for further instructions. Your health care provider may want to do additional tests to assess your baby's well-being. Contact a health care provider if:  You feel fewer than 10 movements in 2 hours.  Your baby is not moving like he or she usually does. Date: ____________ Start time: ____________ Stop time: ____________ Movements: ____________ Date: ____________ Start time: ____________ Stop time: ____________ Movements: ____________ Date: ____________  Start time: ____________ Stop time: ____________ Movements: ____________ Date: ____________ Start time: ____________ Stop time: ____________ Movements: ____________ Date: ____________ Start time: ____________ Stop time: ____________ Movements: ____________ Date: ____________ Start time: ____________ Stop time: ____________ Movements: ____________ Date: ____________ Start time: ____________ Stop time: ____________ Movements: ____________ Date: ____________ Start time: ____________ Stop time: ____________ Movements: ____________ Date: ____________ Start time: ____________ Stop time: ____________ Movements: ____________ This information is not intended to replace advice given to you by your health care provider. Make sure you discuss any questions you have with your health care provider. Document Revised: 01/24/2019 Document Reviewed: 01/24/2019 Elsevier Patient Education  2020 [employer] Braxton Hicks Contractions Contractions of the uterus can occur throughout pregnancy, but they are not always a sign that you are in labor. You may have practice contractions called Braxton Hicks contractions. These false labor contractions are sometimes confused with true labor. What are Braxton Hicks contractions? Braxton Hicks contractions are tightening movements that occur in the muscles of the uterus before labor. Unlike true labor contractions, these contractions do not result in opening (dilation) and thinning of the cervix. Toward the end of pregnancy (32-34 weeks), Braxton Hicks contractions can happen more often and may become stronger. These contractions are sometimes difficult to tell apart from true labor because they can be very uncomfortable. You should not feel embarrassed if you go to the hospital with false labor. Sometimes, the only way to tell if you are in true labor is for your health care provider to look for changes in the cervix. The health care provider   will do a physical exam and may  monitor your contractions. If you are not in true labor, the exam should show that your cervix is not dilating and your water has not broken. If there are no other health problems associated with your pregnancy, it is completely safe for you to be sent home with false labor. You may continue to have Braxton Hicks contractions until you go into true labor. How to tell the difference between true labor and false labor True labor  Contractions last 30-70 seconds.  Contractions become very regular.  Discomfort is usually felt in the top of the uterus, and it spreads to the lower abdomen and low back.  Contractions do not go away with walking.  Contractions usually become more intense and increase in frequency.  The cervix dilates and gets thinner. False labor  Contractions are usually shorter and not as strong as true labor contractions.  Contractions are usually irregular.  Contractions are often felt in the front of the lower abdomen and in the groin.  Contractions may go away when you walk around or change positions while lying down.  Contractions get weaker and are shorter-lasting as time goes on.  The cervix usually does not dilate or become thin. Follow these instructions at home:   Take over-the-counter and prescription medicines only as told by your health care provider.  Keep up with your usual exercises and follow other instructions from your health care provider.  Eat and drink lightly if you think you are going into labor.  If Braxton Hicks contractions are making you uncomfortable: ? Change your position from lying down or resting to walking, or change from walking to resting. ? Sit and rest in a tub of warm water. ? Drink enough fluid to keep your urine pale yellow. Dehydration may cause these contractions. ? Do slow and deep breathing several times an hour.  Keep all follow-up prenatal visits as told by your health care provider. This is important. Contact a  health care provider if:  You have a fever.  You have continuous pain in your abdomen. Get help right away if:  Your contractions become stronger, more regular, and closer together.  You have fluid leaking or gushing from your vagina.  You pass blood-tinged mucus (bloody show).  You have bleeding from your vagina.  You have low back pain that you never had before.  You feel your baby's head pushing down and causing pelvic pressure.  Your baby is not moving inside you as much as it used to. Summary  Contractions that occur before labor are called Braxton Hicks contractions, false labor, or practice contractions.  Braxton Hicks contractions are usually shorter, weaker, farther apart, and less regular than true labor contractions. True labor contractions usually become progressively stronger and regular, and they become more frequent.  Manage discomfort from Braxton Hicks contractions by changing position, resting in a warm bath, drinking plenty of water, or practicing deep breathing. This information is not intended to replace advice given to you by your health care provider. Make sure you discuss any questions you have with your health care provider. Document Revised: 05/19/2017 Document Reviewed: 10/20/2016 Elsevier Patient Education  2020 Elsevier Inc.  

## 2020-05-19 NOTE — MAU Note (Signed)
Contractions stronger now.  Had some dark brown d/c since leaving.

## 2020-05-19 NOTE — MAU Note (Signed)
Started contracting at 10 last night. Now every 2-3 min.  No bleeding or leaking. No recent exam.

## 2020-05-19 NOTE — MAU Note (Signed)
I have communicated with K. Clelia Croft, CNM and reviewed vital signs:  Vitals:   05/19/20 0910 05/19/20 1059  BP: 115/75 119/75  Pulse: 90 80  Resp: 20 18  Temp: 97.8 F (36.6 C) 97.9 F (36.6 C)  SpO2: 100%     Vaginal exam:  Dilation: 3.5 Effacement (%): 90 Cervical Position: Posterior Station: -2 Presentation: Vertex Exam by:: F. Pattiann Solanki, RNC,   Also reviewed contraction pattern and that non-stress test is reactive.  It has been documented that patient is contracting every 1.5-70minutes with minimal cervical change over 1 hour 45 minutes hours not indicating active labor.  Patient denies any other complaints.  Based on this report provider has given order for discharge.  A discharge order and diagnosis entered by a provider.   Labor discharge instructions reviewed with patient.

## 2020-05-19 NOTE — MAU Provider Note (Signed)
   S: Ms. Ashley Greene is a 29 y.o. G1P0 at [redacted]w[redacted]d  who presents to MAU today complaining contractions q 2-3 minutes since 2200 last PM. She denies vaginal bleeding. She denies LOF. She reports normal fetal movement.    O: BP 115/75 (BP Location: Right Arm)   Pulse 90   Temp 97.8 F (36.6 C) (Oral)   Resp 20   Ht 5\' 2"  (1.575 m)   Wt 63.1 kg   LMP 08/09/2019 (Exact Date)   SpO2 100%   BMI 25.46 kg/m  GENERAL: Well-developed, well-nourished female in no acute distress.  HEAD: Normocephalic, atraumatic.  CHEST: Normal effort of breathing, regular heart rate ABDOMEN: Soft, nontender, gravid  Cervical exam:  Dilation: 3 Effacement (%): 90 Cervical Position: Posterior Station: -2 Presentation: Vertex Exam by:: F. Morris, RNC   Fetal Monitoring: Cat 1 Baseline: 135 Variability: mod LTV Accelerations: + Decelerations: none Contractions: q 3-6   A: SIUP at [redacted]w[redacted]d  Probable latent labor  P: Reviewed options w patient over speakerphone as she is still desirous of waterbirth- may stay and we can manage her latent labor w intermittent monitoring, or she can be discharged and return when labor increases/ROM/vag bldg. If labor fizzles out, keep IOL appt on 05/22/20 for postdates  14/3/21 05/19/2020 10:57 AM

## 2020-05-19 NOTE — Progress Notes (Signed)
Pt refusing antibiotics for GBS positive.  Discussed risks of refusing antibiotics. K. Clelia Croft CNM notified of refusal of antibiotics for GBS positive.

## 2020-05-19 NOTE — Anesthesia Procedure Notes (Signed)
Epidural Patient location during procedure: OB Start time: 05/19/2020 6:22 PM End time: 05/19/2020 6:33 PM  Staffing Anesthesiologist: Lannie Fields, DO Performed: anesthesiologist   Preanesthetic Checklist Completed: patient identified, IV checked, risks and benefits discussed, monitors and equipment checked, pre-op evaluation and timeout performed  Epidural Patient position: sitting Prep: DuraPrep and site prepped and draped Patient monitoring: continuous pulse ox, blood pressure, heart rate and cardiac monitor Approach: midline Location: L3-L4 Injection technique: LOR air  Needle:  Needle type: Tuohy  Needle gauge: 17 G Needle length: 9 cm Needle insertion depth: 5.5 cm Catheter type: closed end flexible Catheter size: 19 Gauge Catheter at skin depth: 11 cm Test dose: negative  Assessment Sensory level: T8 Events: blood not aspirated, injection not painful, no injection resistance, no paresthesia and negative IV test  Additional Notes Patient identified. Risks/Benefits/Options discussed with patient including but not limited to bleeding, infection, nerve damage, paralysis, failed block, incomplete pain control, headache, blood pressure changes, nausea, vomiting, reactions to medication both or allergic, itching and postpartum back pain. Confirmed with bedside nurse the patient's most recent platelet count. Confirmed with patient that they are not currently taking any anticoagulation, have any bleeding history or any family history of bleeding disorders. Patient expressed understanding and wished to proceed. All questions were answered. Sterile technique was used throughout the entire procedure. Please see nursing notes for vital signs. Test dose was given through epidural catheter and negative prior to continuing to dose epidural or start infusion. Warning signs of high block given to the patient including shortness of breath, tingling/numbness in hands, complete motor  block, or any concerning symptoms with instructions to call for help. Patient was given instructions on fall risk and not to get out of bed. All questions and concerns addressed with instructions to call with any issues or inadequate analgesia.  Reason for block:procedure for pain

## 2020-05-19 NOTE — Progress Notes (Signed)
Patient ID: Ashley Greene, female   DOB: 07/29/1990, 29 y.o.   MRN: [id number] Doing well Comfortable with epidural  Vitals:   05/19/20 1900 05/19/20 1901 05/19/20 1930 05/19/20 2000  BP: (!) 112/56  119/71 109/63  Pulse:  (!) 105 (!) 101 97  Resp: 18     Temp:      TempSrc:      SpO2: 99%  100%   Weight:      Height:       FHR 140 with average variability and + accelerations UCs every 4-5 minutes  Cervical exam deferred  Discussed suboptimal labor pattern, c/w latent phase.  Offered Pitocin augmentation, declines  Will continue to observe

## 2020-05-20 ENCOUNTER — Encounter (HOSPITAL_COMMUNITY): Payer: Self-pay | Admitting: Family Medicine

## 2020-05-20 DIAGNOSIS — B951 Streptococcus, group B, as the cause of diseases classified elsewhere: Secondary | ICD-10-CM

## 2020-05-20 DIAGNOSIS — Z3A40 40 weeks gestation of pregnancy: Secondary | ICD-10-CM

## 2020-05-20 DIAGNOSIS — O99820 Streptococcus B carrier state complicating pregnancy: Secondary | ICD-10-CM

## 2020-05-20 LAB — RPR: RPR Ser Ql: NONREACTIVE

## 2020-05-20 MED ORDER — SENNOSIDES-DOCUSATE SODIUM 8.6-50 MG PO TABS
2.0000 | ORAL_TABLET | ORAL | Status: DC
  Administered 2020-05-21: 2 via ORAL
  Filled 2020-05-20: qty 2

## 2020-05-20 MED ORDER — DIPHENHYDRAMINE HCL 25 MG PO CAPS: 25.0000 mg | ORAL_CAPSULE | Freq: Four times a day (QID) | ORAL | Status: DC | PRN

## 2020-05-20 MED ORDER — ONDANSETRON HCL 4 MG/2ML IJ SOLN: 4.0000 mg | INTRAMUSCULAR | Status: DC | PRN

## 2020-05-20 MED ORDER — ACETAMINOPHEN 325 MG PO TABS: 650.0000 mg | ORAL_TABLET | ORAL | Status: DC | PRN

## 2020-05-20 MED ORDER — DIBUCAINE (PERIANAL) 1 % EX OINT: 1.0000 "application " | TOPICAL_OINTMENT | CUTANEOUS | Status: DC | PRN

## 2020-05-20 MED ORDER — ONDANSETRON HCL 4 MG PO TABS: 4.0000 mg | ORAL_TABLET | ORAL | Status: DC | PRN

## 2020-05-20 MED ORDER — PRENATAL MULTIVITAMIN CH
1.0000 | ORAL_TABLET | Freq: Every day | ORAL | Status: DC
  Administered 2020-05-21: 1 via ORAL
  Filled 2020-05-20: qty 1

## 2020-05-20 MED ORDER — IBUPROFEN 600 MG PO TABS
600.0000 mg | ORAL_TABLET | Freq: Four times a day (QID) | ORAL | Status: DC
  Administered 2020-05-20 – 2020-05-21 (×4): 600 mg via ORAL
  Filled 2020-05-20 (×4): qty 1

## 2020-05-20 MED ORDER — WITCH HAZEL-GLYCERIN EX PADS: 1.0000 "application " | MEDICATED_PAD | CUTANEOUS | Status: DC | PRN

## 2020-05-20 MED ORDER — DOCUSATE SODIUM 100 MG PO CAPS
100.0000 mg | ORAL_CAPSULE | Freq: Two times a day (BID) | ORAL | Status: DC
  Administered 2020-05-21: 100 mg via ORAL
  Filled 2020-05-20: qty 1

## 2020-05-20 MED ORDER — BENZOCAINE-MENTHOL 20-0.5 % EX AERO
1.0000 "application " | INHALATION_SPRAY | CUTANEOUS | Status: DC | PRN
  Filled 2020-05-20: qty 56

## 2020-05-20 MED ORDER — SIMETHICONE 80 MG PO CHEW: 80.0000 mg | CHEWABLE_TABLET | ORAL | Status: DC | PRN

## 2020-05-20 MED ORDER — COCONUT OIL OIL: 1.0000 "application " | TOPICAL_OIL | Status: DC | PRN

## 2020-05-20 MED ORDER — TETANUS-DIPHTH-ACELL PERTUSSIS 5-2.5-18.5 LF-MCG/0.5 IM SUSY: 0.5000 mL | PREFILLED_SYRINGE | Freq: Once | INTRAMUSCULAR | Status: DC

## 2020-05-20 NOTE — Lactation Note (Signed)
This note was copied from a baby's chart. Lactation Consultation Note  Patient Name: Ashley Greene Today's Date: 05/20/2020 Reason for consult: Initial assessment;Mother's request;Primapara;1st time breastfeeding;Term   Infant is 40 weeks 5 hours old. Mom is on Acyclovir.   Mom noted breast changes during pregnancy and colostrum leakage. Infant latched on the floor based on cues. Mom denies any pain with the latch.   LC arrived and Mom had infant in cradle on the left breast swaddled under her gown. Infant off the nipple and just resting. LC placed infant outside of the gown in football and infant able to latch with signs of milk transfer for 7 minutes.   LC assisted Mom in adjusting the latch to ensure lips are flanged top and bottom. Mom shown how to do a chin tug and she stated pain resolved with the latch.   LC reviewed I's and O' sheet with parents.   Plan 1. To feed based on cues 8-12x in 24 hour period no more than 3 hours without an attempt.  Mom to place infant STS and look for signs of milk transfer.            2. Mom taught hand expression for stimulation.             3. Dad track feeding and output on I's and O's sheet.              4.Brochure of inpatient and outpatient services reviewed with parents.  Ashley Bache  Greene 05/20/2020, 5:44 PM

## 2020-05-20 NOTE — Progress Notes (Signed)
Patient ID: Ashley Greene, female   DOB: [birth year], 29 y.o.   MRN: [id number] Doing well Comfortable with epidural  Has progressed steadily throughout the night  Vitals:   05/20/20 0130 05/20/20 0200 05/20/20 0230 05/20/20 0300  BP: 103/61 107/60 110/63 105/61  Pulse: 80 82 87 84  Resp:      Temp:      TempSrc:      SpO2:      Weight:      Height:        FHR reassuring UCs q 2-4 min  Dilation: 9 Effacement (%): 90 Cervical Position: Middle Station: -1, 0 Presentation: Vertex Exam by:: Lovenia Shuck, RN  Will let baby labor down until pt feels pressure then start pusing

## 2020-05-20 NOTE — Discharge Summary (Signed)
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 yo G1P0 at 437w5das admitted in Latent Labor on 05/19/2020. Patient had an uncomplicated labor course as follows:  Membrane Rupture Time/Date: 7:50 AM ,05/20/2020   Delivery Method:Vaginal, Spontaneous  Episiotomy: None  Lacerations:  Periurethral  Patient had an uncomplicated postpartum course.  She is ambulating, tolerating a regular diet, passing flatus, and urinating well. Patient is discharged home in stable condition on 05/21/20.  Newborn Data: Birth date:05/20/2020  Birth time:12:01 PM  Gender:Female  Living status:Living  Apgars:8 ,9  Weight:3530 g   Magnesium Sulfate received: No BMZ received: No Rhophylac: Received 12/2   MMR:N/A T-DaP:Given prenatally Flu: No Transfusion:No  Physical exam  Vitals:   05/20/20 1530 05/20/20 1952 05/21/20 0043 05/21/20 0510  BP: 116/68 100/61 (!) 103/58 107/65  Pulse: 100 76 83 63  Resp: 18 16 16 18   Temp: 98.2 F (36.8 C) 97.9 F (36.6 C) 98.2 F (36.8 C) 98.1 F (36.7 C)  TempSrc: Tympanic Oral Oral Oral  SpO2: 97% 99% 98% 100%  Weight:      Height:       General: alert, cooperative and no distress Lochia: appropriate Uterine Fundus: firm  DVT  Evaluation: No evidence of DVT seen on physical exam. Labs: Lab Results  Component Value Date   WBC 18.0 (H) 05/19/2020   HGB 12.0 05/19/2020   HCT 37.0 05/19/2020   MCV 91.4 05/19/2020   PLT 356 05/19/2020   CMP Latest Ref Rng & Units 03/26/2020  Glucose 70 - 99 mg/dL 98  BUN 6 - 20 mg/dL 9  Creatinine 0.44 - 1.00 mg/dL 0.50  Sodium 135 - 145 mmol/L 135  Potassium 3.5 - 5.1 mmol/L 4.1  Chloride 98 - 111 mmol/L 104  CO2 22 - 32 mmol/L 22  Calcium 8.9 - 10.3 mg/dL 8.7(L)  Total Protein 6.5 - 8.1 g/dL 6.1(L)  Total Bilirubin 0.3 - 1.2 mg/dL 0.4  Alkaline Phos 38 - 126 U/L 133(H)  AST 15 - 41 U/L 30  ALT 0 - 44 U/L 47(H)   EdFlavia Shippercore: Edinburgh Postnatal Depression Scale Screening Tool 05/20/2020  I have been able to laugh and see the funny side of things. (No Data)     After visit meds:  Allergies as of 05/21/2020      Reactions   Amoxicillin Diarrhea   Stomach upset    Valtrex [valacyclovir]    Stomach upset       Medication List    STOP taking these medications   acyclovir 400 MG tablet Commonly known  as: ZOVIRAX     TAKE these medications   acetaminophen 325 MG tablet Commonly known as: Tylenol Take 2 tablets (650 mg total) by mouth every 4 (four) hours as needed (for pain scale < 4).   coconut oil Oil Apply 1 application topically as needed.   ibuprofen 600 MG tablet Commonly known as: ADVIL Take 1 tablet (600 mg total) by mouth every 6 (six) hours.   PRENATAL VITAMINS PO Take by mouth.        Discharge home in stable condition Infant Feeding: Breast Infant Disposition:home with mother Discharge instruction: per After Visit Summary and Postpartum booklet. Activity: Advance as tolerated. Pelvic rest for 6 weeks.  Diet: routine diet Future Appointments: Future Appointments  Date Time Provider [place]  06/17/2020  8:35 AM Clarnce Flock, MD [facility]   Follow up Visit:   Please schedule this patient for a Virtual  postpartum visit in 4 weeks with the following provider: Any provider. Additional Postpartum F/U:none  Low risk pregnancy complicated by: rh negative Delivery mode:  Vaginal, Spontaneous  Anticipated Birth Control:  Unsure   05/21/2020 Janet Berlin, MD

## 2020-05-20 NOTE — Progress Notes (Signed)
Patient ID: Ashley Greene, female   DOB: [birth year], 29 y.o.   MRN: [id number] Feeling a little pressure Vitals:   05/20/20 0600 05/20/20 0630 05/20/20 0700 05/20/20 0731  BP: 115/67 110/69 112/68 112/73  Pulse: 97 88 96 81  Resp:    16  Temp:    98.5 F (36.9 C)  TempSrc:    Oral  SpO2:      Weight:      Height:       FHR reassuring UCs irregular  Dilation: 10 Effacement (%): 100 Cervical Position: Anterior Station: Plus 1 Presentation: Vertex Exam by:: Wiliams CNM  AROM with exam Clear fluid  Patient wants to labor down

## 2020-05-21 ENCOUNTER — Ambulatory Visit: Payer: Self-pay

## 2020-05-21 MED ORDER — IBUPROFEN 600 MG PO TABS
600.0000 mg | ORAL_TABLET | Freq: Four times a day (QID) | ORAL | 0 refills | Status: DC
Start: 2020-05-21 — End: 2020-05-25

## 2020-05-21 MED ORDER — RHO D IMMUNE GLOBULIN 1500 UNIT/2ML IJ SOSY
300.0000 ug | PREFILLED_SYRINGE | Freq: Once | INTRAMUSCULAR | Status: AC
  Administered 2020-05-21: 300 ug via INTRAMUSCULAR
  Filled 2020-05-21: qty 2

## 2020-05-21 MED ORDER — ACETAMINOPHEN 325 MG PO TABS
650.0000 mg | ORAL_TABLET | ORAL | Status: DC | PRN
Start: 2020-05-21 — End: 2020-09-09

## 2020-05-21 MED ORDER — COCONUT OIL OIL
1.0000 | TOPICAL_OIL | 0 refills | Status: DC | PRN
Start: 2020-05-21 — End: 2020-09-09

## 2020-05-21 NOTE — Lactation Note (Signed)
This note was copied from a baby's chart. Lactation Consultation Note  Patient Name: Ashley Greene OMBTD'H Date: 05/21/2020 Reason for consult: Follow-up assessment  Follow up to 33 hours old infant with 2.12% weight loss at time of visit. Infant is sleeping in father's arms upon arrival. Mother states breastfeeding is going well and does not reports any problems at this point.   Reviewed with mother average size of a NB stomach. Encourage to follow babies' hunger and fullness cues. Reviewed importance to offer the breast 8 to 12 times in a 24-hour period for proper stimulation and to establish good milk supply.     Reviewed breastfeeding basics. Discussed milk coming to volume. Reviewed newborn behavior and expectations with mother and encouraged to contact [facility] for support, questions or concerns.    All questions answered at this time. Provided colostrum vials for EBM.   Maternal Data Does the patient have breastfeeding experience prior to this delivery?: No  Interventions Interventions: Breast feeding basics reviewed;Expressed milk;Hand express  Lactation Tools Discussed/Used Pump Review: Milk Storage   Consult Status Consult Status: Follow-up Date: 05/22/20 Follow-up type: In-patient    Berenise Hunton A Higuera Ancidey 05/21/2020, 9:25 PM

## 2020-05-21 NOTE — Progress Notes (Addendum)
POSTPARTUM PROGRESS NOTE  Subjective: Ashley Greene is a 29 y.o. G1P1001 s/p SVD at [redacted]w[redacted]d.  She reports she is doing well. No acute events overnight. She denies any problems with ambulating, voiding or po intake. Denies nausea or vomiting. She has has passed flatus. Pain is well controlled.  Lochia is moderate, but denies any passage of large clots. No dizziness, has been able to ambulate around the room without difficulty.   Objective: Blood pressure 107/65, pulse 63, temperature 98.1 F (36.7 C), temperature source Oral, resp. rate 18, height 5\' 2"  (1.575 m), weight 63 kg, last menstrual period 08/09/2019, SpO2 100 %, unknown if currently breastfeeding.  Physical Exam:  General: alert, cooperative and no distress Chest: no respiratory distress Abdomen: soft, non-tender  Uterine Fundus: firm and at level of umbilicus Extremities: No calf swelling or tenderness  No edema  Recent Labs    05/19/20 1740  HGB 12.0  HCT 37.0    Assessment/Plan: Ashley Greene is a 29 y.o. G1P1001 s/p SVD at [redacted]w[redacted]d, SOL.   Routine Postpartum Care: Doing well, pain well-controlled.  -- Continue routine care, lactation support  -- Contraception: Declines -- Feeding: Breast  Dispo: Plan for discharge home tomorrow.  [redacted]w[redacted]d, MD 05/21/2020 6:56 AM   GME ATTESTATION:  I saw and evaluated the patient. I agree with the findings and the plan of care as documented in the resident's note.  14/07/2019, MD OB Fellow, Faculty [facility], [facility] 05/21/2020 7:24 AM

## 2020-05-21 NOTE — Anesthesia Postprocedure Evaluation (Signed)
Anesthesia Post Note  Patient: Ashley Greene  Procedure(s) Performed: AN AD HOC LABOR EPIDURAL     Patient location during evaluation: Mother Baby Anesthesia Type: Epidural Level of consciousness: awake and alert Pain management: pain level controlled Vital Signs Assessment: post-procedure vital signs reviewed and stable Respiratory status: spontaneous breathing, nonlabored ventilation and respiratory function stable Cardiovascular status: stable Postop Assessment: no headache, no backache and epidural receding Anesthetic complications: no   No complications documented.  Last Vitals:  Vitals:   05/21/20 0043 05/21/20 0510  BP: (!) 103/58 107/65  Pulse: 83 63  Resp: 16 18  Temp: 36.8 C 36.7 C  SpO2: 98% 100%    Last Pain:  Vitals:   05/21/20 0715  TempSrc:   PainSc: 0-No pain   Pain Goal: Patients Stated Pain Goal: 2 (05/21/20 0043)                 Fanny Dance

## 2020-05-22 ENCOUNTER — Inpatient Hospital Stay (HOSPITAL_COMMUNITY)
Admission: AD | Admit: 2020-05-22 | Payer: BC Managed Care – PPO | Source: Home / Self Care | Admitting: Obstetrics & Gynecology

## 2020-05-22 ENCOUNTER — Ambulatory Visit: Payer: Self-pay

## 2020-05-22 ENCOUNTER — Inpatient Hospital Stay (HOSPITAL_COMMUNITY): Payer: BC Managed Care – PPO

## 2020-05-22 LAB — RH IG WORKUP (INCLUDES ABO/RH)
ABO/RH(D): O NEG
Fetal Screen: NEGATIVE
Gestational Age(Wks): 40.5
Unit division: 0

## 2020-05-22 NOTE — Lactation Note (Signed)
This note was copied from a baby's chart. Lactation Consultation Note  Patient Name: Ashley Greene [id number] Date: 05/22/2020 Reason for consult: Follow-up assessment;Primapara  Mom and baby are scheduled to d/c today. Infant's output and wt loss are wnl. Parents are comfortable identifying infant feeding cues and latching/bf. They recall that baby clusterfed from 1-3am and fed well again at about 5am and 8am. Baby was sleeping soundly during visit. During this visit we reviewed feeding and output norms as well as onset of copious milk supply and pumping prn. Mom denies breast pain or difficult latch. Mom is aware of community resources and will reach out for bf assistance prn. Parents are aware that infant needs a weight check in the next 1-2 days. They are able to teach-back expected infant output and feeding needs. Patient was provided with the opportunity to ask questions. All concerns were addressed.     Consult Status Consult Status: Complete Date: 05/22/20 Follow-up type: In-patient    Elder Negus 05/22/2020, 9:37 AM

## 2020-05-23 ENCOUNTER — Ambulatory Visit: Payer: Self-pay

## 2020-05-23 NOTE — Lactation Note (Signed)
This note was copied from a baby's chart. Lactation Consultation Note  Patient Name: Ashley Greene Today's Date: 05/23/2020 Reason for consult: Difficult latch;Follow-up assessment;Primapara;1st time breastfeeding;Hyperbilirubinemia  Follow up to 75 hours old infant with 8.78% weight loss and receiving phototherapy at the time of visit  . RN requested LC support due to uncomfortable latch. Mother reports pain with latch despite of nipple shield.   Mother has a 24 mm NS in place. Assisted to latch infant to left breast, football position. Infant is eager and latches quickly with ease. Mother reports pinching sensation. Corrected curled lips and tugged at chin. Mother states latch is still painful. LC unlatched infant and re-attempted latch but mother reports no changes. Encouraged mother to have baby skin to skin. Infant had a void, LC assisted with diaper change. Infant continues to display hunger cues. Observed tongue cupping up when crying and does not seem to extend pass gum-line.    LC spoon-fed 15 mL of EBM. Infant seems content. Talked to parents about using a DEBP. Parents agreed. Reviewed care of parts, frequency and milk storage. Mother collected 28 mL of EBM with initiation setting. Discussed transitioning to maintenance setting.   Infant started cueing again. Taught paced bottle-feeding and demonstrated technique. FOB fed infant ~24mL of EBM (with purple nipple) in a upright position and burping frequently. Infant seems relaxed and content after feeding. FOB placed infant back to phototherapy.    LC provided supplementation volume guidelines. Reinforced pumping every 2-3 hours since mother is showing signs of milk coming into volume. Offered to shared information about difficult latch with RN and pediatrician for further evaluation.   All questions answered at this time. Encouraged to contact for support.   Feeding plan:  1. Feed infant following hunger cues or 8 - 12 times in 24h  period.  2. Follow volume guidelines according to infant age, pace bottle feeding and fullness cues 3. Pump every 2-3 hours transitioning to maintenance setting. 4. Apply EBM to nipples, air-dry and use comfort gels to heal nipples.  5. Encouraged maternal rest, hydration and food intake.  6. Contact Lactation Services or local resources for support, questions or concerns.    All questions answered at this time.    Maternal Data Has patient been taught Hand Expression?: Yes Does the patient have breastfeeding experience prior to this delivery?: No  Feeding Feeding Type: Breast Fed  LATCH Score Latch: Grasps breast easily, tongue down, lips flanged, rhythmical sucking.  Audible Swallowing: Spontaneous and intermittent  Type of Nipple: Everted at rest and after stimulation (short shaft)  Comfort (Breast/Nipple): Filling, red/small blisters or bruises, mild/mod discomfort  Hold (Positioning): Assistance needed to correctly position infant at breast and maintain latch.  LATCH Score: 8  Interventions Interventions: Breast feeding basics reviewed;Assisted with latch;Skin to skin;Breast massage;Hand express;Adjust position;DEBP;Comfort gels;Expressed milk;Position options;Support pillows  Lactation Tools Discussed/Used Tools: Pump;Flanges;Comfort gels;Nipple Dorris Carnes;Bottle Nipple shield size: 20 Flange Size: 24 Breast pump type: Double-Electric Breast Pump WIC Program: No Pump Review: Setup, frequency, and cleaning;Milk Storage Initiated by:: Erving Sassano IBCLC Date initiated:: 05/23/20   Consult Status Consult Status: Follow-up Date: 05/24/20 Follow-up type: In-patient    Gracie Gupta A Higuera Ancidey 05/23/2020, 3:13 PM

## 2020-05-23 NOTE — Lactation Note (Signed)
This note was copied from a baby's chart. Lactation Consultation Note  Patient Name: Ashley Greene No Today's Date: 05/23/2020   Parents sleeping.  LC will return later.      Maternal Data    Feeding Feeding Type: Breast Milk  LATCH Score                   Interventions    Lactation Tools Discussed/Used     Consult Status      Hardie Pulley 05/23/2020, 8:22 AM

## 2020-05-24 ENCOUNTER — Ambulatory Visit: Payer: Self-pay

## 2020-05-24 NOTE — Lactation Note (Addendum)
This note was copied from a baby's chart. Lactation Consultation Note  Patient Name: Ashley Greene Today's Date: 05/24/2020   F/u visit attempted, but everyone in the room was sleeping. Infant was noted to be in bassinet. Lactation to f/u later.  Infant has gained 75 g over the last 24 hours.  [name] [facility] 05/24/2020, 8:53 AM

## 2020-05-24 NOTE — Lactation Note (Signed)
This note was copied from a baby's chart. Lactation Consultation Note  Patient Name: Ashley Greene Date: 05/24/2020 Reason for consult: Follow-up assessment  Infant is 61 hrs old. Infant was recently bottle-fed prior to entering room. Mom says she is pumping about q 2.5 hrs. She says she has been getting 35 mL from her L breast and  15-20 ml from her R breast.  Mom's nipples are intact & appear atraumatic. Mom has been wearing Comfort Gels. Instructions (per manufacturer) of gently cleaning breast after wearing comfort gels prior to pumping/breastfeeding reviewed. Mom also knows not to wear nipple creams at the same time as the gel pads.  When Mom's nipples were at rest, it appeared that she needed size 21 flanges. Size 21 flanges were tried, initially, but then changed back to size 24. Once she was using the size 24 flanges, her nipples seemed to have the appearance of elastic nipples; however, Mom was still feeling pinching and wanted to try the size 27. Mom did feel more comfortable with the size 27 flanges. When the size 27 flanges were removed at the end of pumping, it appeared that the flange size was too large based on the appearance of the area around the nipple.   I shared my observation with Mom and suggested that she look in the mirror prior to pumping and then again after using the size 27 flanges so she could see also what I had noted. I also showed her Pumpin' Pals since they are compatible with her Lansinoh pump and may give her more accurate sizing without the sensation of pinching. Mom was thankful for the info.  Per RN request (and previous LC's note), I also provided her with a list of dentists/ENTs who work with tongue & lip restriction. 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 Mom also inquired about seeing lactation consultants after d/c (home or office visits).    [name]  [facility] 05/24/2020, 10:05 AM

## 2020-05-25 ENCOUNTER — Ambulatory Visit (INDEPENDENT_AMBULATORY_CARE_PROVIDER_SITE_OTHER): Payer: BC Managed Care – PPO | Admitting: General Practice

## 2020-05-25 ENCOUNTER — Other Ambulatory Visit: Payer: Self-pay

## 2020-05-25 VITALS — BP 103/72 | HR 99 | Ht 62.0 in | Wt 127.0 lb

## 2020-05-25 DIAGNOSIS — R300 Dysuria: Secondary | ICD-10-CM | POA: Diagnosis not present

## 2020-05-25 LAB — POCT URINALYSIS DIP (DEVICE)
Bilirubin Urine: NEGATIVE
Glucose, UA: NEGATIVE mg/dL
Ketones, ur: NEGATIVE mg/dL
Nitrite: NEGATIVE
Protein, ur: NEGATIVE mg/dL
Specific Gravity, Urine: 1.025 (ref 1.005–1.030)
Urobilinogen, UA: 0.2 mg/dL (ref 0.0–1.0)
pH: 6.5 (ref 5.0–8.0)

## 2020-05-25 MED ORDER — IBUPROFEN 800 MG PO TABS: 800.0000 mg | ORAL_TABLET | Freq: Three times a day (TID) | ORAL | 0 refills | Status: AC | PRN

## 2020-05-25 NOTE — Progress Notes (Signed)
I have reviewed and agree with the care plan as documented in the RN note. UA unremarkable. Suspect urethral irritation from foley trauma as well as from periurethral laceration.    Casper Harrison, MD [facility] Fellow, [facility] for [facility], [facility]

## 2020-05-25 NOTE — Progress Notes (Signed)
Patient presents to office today reporting dysuria starting yesterday. Denies frequency, incomplete emptying or flank pain. Per chart review, patient had SVD 5 days ago and periurethral tear. She reports multiple catheters placed during admission. UA is unremarkable. Discussed with Dr Myriam Jacobson who feels pain is likely due to urethral irritation and will improve with time- patient should continue Ibuprofen. Discussed with patient and recommended she continue with Ibuprofen, sitz baths, spray bottle with warm water while voiding, or try using dermoplast spray. Ibuprofen Rx sent to pharmacy per patient request. Patient verbalized understanding to all.   Chase Caller RN BSN 05/25/20

## 2020-05-25 NOTE — Patient Instructions (Signed)
Lactation Consultant at office- Jasmine December

## 2020-05-26 ENCOUNTER — Encounter (HOSPITAL_COMMUNITY): Payer: Self-pay | Admitting: Family Medicine

## 2020-06-17 ENCOUNTER — Other Ambulatory Visit: Payer: Self-pay

## 2020-06-17 ENCOUNTER — Telehealth (INDEPENDENT_AMBULATORY_CARE_PROVIDER_SITE_OTHER): Payer: Self-pay | Admitting: Family Medicine

## 2020-06-17 DIAGNOSIS — Z91199 Patient's noncompliance with other medical treatment and regimen due to unspecified reason: Secondary | ICD-10-CM

## 2020-06-17 DIAGNOSIS — Z5329 Procedure and treatment not carried out because of patient's decision for other reasons: Secondary | ICD-10-CM

## 2020-06-17 NOTE — Progress Notes (Signed)
No show, will reschedule for in person visit

## 2020-07-06 ENCOUNTER — Ambulatory Visit: Payer: BC Managed Care – PPO | Admitting: Family Medicine

## 2020-07-20 ENCOUNTER — Ambulatory Visit: Payer: BC Managed Care – PPO | Admitting: Certified Nurse Midwife

## 2020-08-07 ENCOUNTER — Encounter: Payer: Self-pay | Admitting: Emergency Medicine

## 2020-08-07 ENCOUNTER — Ambulatory Visit
Admission: EM | Admit: 2020-08-07 | Discharge: 2020-08-07 | Disposition: A | Discharge status: Home / Self Care | Payer: BC Managed Care – PPO

## 2020-08-07 ENCOUNTER — Other Ambulatory Visit: Payer: Self-pay

## 2020-08-07 DIAGNOSIS — W19XXXA Unspecified fall, initial encounter: Secondary | ICD-10-CM | POA: Diagnosis not present

## 2020-08-07 DIAGNOSIS — S0990XA Unspecified injury of head, initial encounter: Secondary | ICD-10-CM | POA: Diagnosis not present

## 2020-08-07 DIAGNOSIS — R519 Headache, unspecified: Secondary | ICD-10-CM | POA: Diagnosis not present

## 2020-08-07 NOTE — ED Triage Notes (Signed)
Pt reports slipping on some ice and hit back of head.  Since then she has been having headaches, some nausea , neck stiffness  And fatigue since the incident.

## 2020-08-07 NOTE — Discharge Instructions (Signed)
May take tylenol and ibuprofen for headaches  I have attached information for concussion for you here  Follow up with this office or with primary care if symptoms are persisting.  Follow up in the ER for high fever, trouble swallowing, trouble breathing, other concerning symptoms.

## 2020-08-08 NOTE — ED Provider Notes (Signed)
[facility]    CSN: [id number] Arrival date & time: 08/07/20  1322      History   Chief Complaint Chief Complaint  Patient presents with  . Head Injury    HPI Ashley Greene is a 30 y.o. female.   Reports that she fell in the ice last week and hit the back of her head. Reports that she has been having headaches, neck pain and stiffness and fatigue since then. Denies LOC, nausea, vomiting, changes in vision, tinnitus, other symptoms.  ROS per HPI  The history is provided by the patient.    Past Medical History:  Diagnosis Date  . Medical history non-contributory     Patient Active Problem List   Diagnosis Date Noted  . Post term pregnancy over 40 weeks 05/19/2020  . Positive GBS test 04/27/2020  . Rh negative state in antepartum period 11/11/2019  . Rubella immune 11/11/2019  . Supervision of normal first pregnancy, antepartum 10/15/2019  . 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 WISDOM TOOTH EXTRACTION      OB History as of 08/07/2020    Gravida  1   Para  1   Term  1   Preterm      AB      Living  1     SAB      IAB      Ectopic      Multiple  0   Live Births  1            Home Medications    Prior to Admission medications   Medication Sig Start Date End Date Taking? Authorizing Provider  acetaminophen (TYLENOL) 325 MG tablet Take 2 tablets (650 mg total) by mouth every 4 (four) hours as needed (for pain scale < 4). 05/21/20   Marsala, Arlana Pouch, MD  coconut oil OIL Apply 1 application topically as needed. 05/21/20   Gita Kudo, MD  ibuprofen (ADVIL) 800 MG tablet Take 1 tablet (800 mg total) by mouth every 8 (eight) hours as needed. 05/25/20   Gita Kudo, MD  Prenatal Vit-Fe Fumarate-FA (PRENATAL VITAMINS PO) Take by mouth.    [provider]    Family History Family History  Problem Relation Age of Onset  . Healthy Mother   . Healthy Father     Social  History Social History   Tobacco Use  . Smoking status: Never Smoker  . Smokeless tobacco: Never Used  Vaping Use  . Vaping Use: Never used  Substance Use Topics  . Alcohol use: No  . Drug use: No     Allergies   Amoxicillin and Valtrex [valacyclovir]   Review of Systems Review of Systems   Physical Exam Triage Vital Signs ED Triage Vitals  Enc Vitals Group     BP 08/07/20 1332 119/70     Pulse Rate 08/07/20 1332 81     Resp 08/07/20 1332 18     Temp 08/07/20 1332 (!) 97.5 F (36.4 C)     Temp Source 08/07/20 1332 Oral     SpO2 08/07/20 1332 98 %     Weight --      Height --      Head Circumference --      Peak Flow --      Pain Score 08/07/20 1344 0     Pain Loc --      Pain Edu? --  Excl. in GC? --    No data found.  Updated Vital Signs BP 119/70   Pulse 81   Temp (!) 97.5 F (36.4 C) (Oral)   Resp 18   LMP 07/11/2019   SpO2 98%   Visual Acuity Right Eye Distance:   Left Eye Distance:   Bilateral Distance:    Right Eye Near:   Left Eye Near:    Bilateral Near:     Physical Exam Vitals and nursing note reviewed.  Constitutional:      General: She is not in acute distress.    Appearance: Normal appearance. She is well-developed and well-nourished. She is not ill-appearing.  HENT:     Head: Normocephalic and atraumatic.     Right Ear: Tympanic membrane, ear canal and external ear normal.     Left Ear: Tympanic membrane, ear canal and external ear normal.     Nose: Nose normal.     Mouth/Throat:     Mouth: Mucous membranes are moist.     Pharynx: Oropharynx is clear.  Eyes:     Extraocular Movements: Extraocular movements intact.     Conjunctiva/sclera: Conjunctivae normal.     Pupils: Pupils are equal, round, and reactive to light.  Cardiovascular:     Rate and Rhythm: Normal rate and regular rhythm.     Heart sounds: Normal heart sounds. No murmur heard.   Pulmonary:     Effort: Pulmonary effort is normal. No respiratory  distress.     Breath sounds: Normal breath sounds. No stridor. No wheezing, rhonchi or rales.  Chest:     Chest wall: No tenderness.  Abdominal:     Palpations: Abdomen is soft.     Tenderness: There is no abdominal tenderness.  Musculoskeletal:        General: No edema. Normal range of motion.     Cervical back: Normal range of motion and neck supple.  Skin:    General: Skin is warm and dry.     Capillary Refill: Capillary refill takes less than 2 seconds.  Neurological:     General: No focal deficit present.     Mental Status: She is alert and oriented to person, place, and time.     Cranial Nerves: No cranial nerve deficit.     Sensory: No sensory deficit.     Motor: No weakness.     Coordination: Coordination normal.     Gait: Gait normal.     Deep Tendon Reflexes: Reflexes normal.  Psychiatric:        Mood and Affect: Mood and affect and mood normal.        Behavior: Behavior normal.        Thought Content: Thought content normal.      UC Treatments / Results  Labs (all labs ordered are listed, but only abnormal results are displayed) Labs Reviewed - No data to display  EKG   Radiology No results found.  Procedures Procedures (including critical care time)  Medications Ordered in UC Medications - No data to display  Initial Impression / Assessment and Plan / UC Course  I have reviewed the triage vital signs and the nursing notes.  Pertinent labs & imaging results that were available during my care of the patient were reviewed by me and considered in my medical decision making (see chart for details).    Head Injury Fall Headache  Low suspicion for hemorrhage at this time since we are so far out from the fall May take  ibuprofen or tylenol as needed for headaches Concussion information handout provided Discussed symptoms to seek further evaluation Follow up as needed  Final Clinical Impressions(s) / UC Diagnoses   Final diagnoses:  Nonintractable  headache, unspecified chronicity pattern, unspecified headache type  Fall, initial encounter  Injury of head, initial encounter     Discharge Instructions     May take tylenol and ibuprofen for headaches  I have attached information for concussion for you here  Follow up with this office or with primary care if symptoms are persisting.  Follow up in the ER for high fever, trouble swallowing, trouble breathing, other concerning symptoms.     ED Prescriptions    None     PDMP not reviewed this encounter.   Moshe Cipro, NP 08/09/20 [phone]

## 2020-08-25 ENCOUNTER — Ambulatory Visit (INDEPENDENT_AMBULATORY_CARE_PROVIDER_SITE_OTHER): Payer: BC Managed Care – PPO | Admitting: Nurse Practitioner

## 2020-08-25 ENCOUNTER — Other Ambulatory Visit: Payer: Self-pay

## 2020-08-25 ENCOUNTER — Encounter: Payer: Self-pay | Admitting: Nurse Practitioner

## 2020-08-25 DIAGNOSIS — M549 Dorsalgia, unspecified: Secondary | ICD-10-CM | POA: Diagnosis not present

## 2020-08-25 DIAGNOSIS — B009 Herpesviral infection, unspecified: Secondary | ICD-10-CM

## 2020-08-25 DIAGNOSIS — Z7689 Persons encountering health services in other specified circumstances: Secondary | ICD-10-CM | POA: Diagnosis not present

## 2020-08-25 MED ORDER — LIDOCAINE 4 % EX PTCH: 1.0000 | MEDICATED_PATCH | Freq: Every day | CUTANEOUS | 1 refills | Status: DC | PRN

## 2020-08-25 NOTE — Assessment & Plan Note (Signed)
-  no issues today -would refill acyclovir if she has outbreaks

## 2020-08-25 NOTE — Progress Notes (Signed)
New Patient Office Visit  Subjective:  Patient ID: Ashley Greene, female    DOB: 1990-12-10  Age: 30 y.o. MRN: [id number]  CC:  Chief Complaint  Patient presents with  . New Patient (Initial Visit)    HPI Ashley Greene presents for new patient visit. No recent PCP. Last physical was Jan 2021 at [facility]. No recent blood work.  She was seen at urgent care on 08/07/20 for a fall (the week prior, around 08/01/19) on ice that resulted in headache, neck pain, stiffness, and fatigue.  She had a daughter 3 months ago. She is currently breastfeeding. She states she has had back pain since delivery to left middle back as well as lower back.  Past Medical History:  Diagnosis Date  . Medical history non-contributory   . Positive GBS test 04/27/2020   Declines antibiotics, counseled  . Post term pregnancy over 40 weeks 05/19/2020  . Rubella immune 11/11/2019  . Supervision of normal first pregnancy, antepartum 10/15/2019    Nursing Staff Provider Office Location [facility] Dating   LMP Language  English  Anatomy US   normal Flu Vaccine  Declined-11/11/19 Genetic Screen  NIPS: normal female AFP: negtive  First Screen:  Quad:   TDaP vaccine  03/17/20 Hgb A1C or  GTT Early  n/a Third trimester neg Rhogam  02/25/20   LAB RESULTS    Blood Type   ONeg Feeding Plan Breast  Antibody  Neg Contraception None Rubella   Immune Circumc    Past Surgical History:  Procedure Laterality Date  . WISDOM TOOTH EXTRACTION      Family History  Problem Relation Age of Onset  . Healthy Mother   . Healthy Father     Social History   Socioeconomic History  . Marital status: Single    Spouse name: Not on file  . Number of children: Not on file  . Years of education: Not on file  . Highest education level: Not on file  Occupational History  . 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  Tobacco Use  . Smoking status: Never Smoker  . Smokeless tobacco: Never Used  Vaping  Use  . Vaping Use: Never used  Substance and Sexual Activity  . Alcohol use: No  . Drug use: No  . Sexual activity: Yes    Birth control/protection: None  Other Topics Concern  . Not on file  Social History Narrative  . Not on file   Social Determinants of Health   Financial Resource Strain: Not on file  Food Insecurity: No Food Insecurity  . Worried About Programme researcher, broadcasting/film/video in the Last Year: Never true  . Ran Out of Food in the Last Year: Never true  Transportation Needs: No Transportation Needs  . Lack of Transportation (Medical): No  . Lack of Transportation (Non-Medical): No  Physical Activity: Not on file  Stress: Not on file  Social Connections: Not on file  Intimate Partner Violence: Not on file    ROS Review of Systems  Constitutional: Negative.   Respiratory: Negative.   Cardiovascular: Negative.   Musculoskeletal: Positive for back pain.       To left upper back aswell as mid-lower back  Psychiatric/Behavioral: Negative.     Objective:   Today's Vitals: BP 106/72   Pulse 82   Temp 98.3 F (36.8 C)   Resp 20   Ht 5\' 2"  (1.575 m)   Wt 123 lb (55.8 kg)  SpO2 98%   BMI 22.50 kg/m   Physical Exam Constitutional:      Appearance: Normal appearance.  Cardiovascular:     Rate and Rhythm: Normal rate and regular rhythm.     Pulses: Normal pulses.     Heart sounds: Normal heart sounds.  Pulmonary:     Effort: Pulmonary effort is normal.     Breath sounds: Normal breath sounds.  Musculoskeletal:        General: Tenderness present.     Comments: Tender to palpation to left mid-back; pain with ROM exercises  Neurological:     Mental Status: She is alert.     Assessment & Plan:   Problem List Items Addressed This Visit      Other   HSV-2 infection    -no issues today -would refill acyclovir if she has outbreaks      Encounter to establish care    -no recent PCP -followed by GYN; delivered baby in late 2021      Back pain    -started  after giving birth -to left side of spine to upper T-spine; also has pain to mid lower back; has pain with ROM -avoiding prescribing muscle relaxers today d/t lactation -Rx. Lidocaine patch for pain; not likely to affect lactation -she is considering chiropractor, Dr. Ladona Ridgel         Outpatient Encounter Medications as of 08/25/2020  Medication Sig  . acetaminophen (TYLENOL) 325 MG tablet Take 2 tablets (650 mg total) by mouth every 4 (four) hours as needed (for pain scale < 4).  . coconut oil OIL Apply 1 application topically as needed.  Marland Kitchen ibuprofen (ADVIL) 800 MG tablet Take 1 tablet (800 mg total) by mouth every 8 (eight) hours as needed.  . Lidocaine (HM LIDOCAINE PATCH) 4 % PTCH Apply 1 each topically daily as needed.  . Prenatal Vit-Fe Fumarate-FA (PRENATAL VITAMINS PO) Take by mouth.   No facility-administered encounter medications on file as of 08/25/2020.    Follow-up: Return in about 2 weeks (around 09/08/2020) for Physical exam.   Shaneque Roberts, NP

## 2020-08-25 NOTE — Patient Instructions (Signed)
We will meet back up in 2 weeks for physical. Please get fasting labs drawn 2-3 days prior to your appointment so we can discuss them at the time of your physical. If you can't get them done before your appointment, please come to your appointment fasting and we can draw them then.

## 2020-08-25 NOTE — Assessment & Plan Note (Addendum)
-  no recent PCP -followed by GYN; delivered baby in late 2021

## 2020-08-25 NOTE — Assessment & Plan Note (Signed)
-  started after giving birth -to left side of spine to upper T-spine; also has pain to mid lower back; has pain with ROM -avoiding prescribing muscle relaxers today d/t lactation -Rx. Lidocaine patch for pain; not likely to affect lactation -she is considering chiropractor, Dr. Ladona Ridgel

## 2020-08-31 DIAGNOSIS — M546 Pain in thoracic spine: Secondary | ICD-10-CM | POA: Diagnosis not present

## 2020-08-31 DIAGNOSIS — M5442 Lumbago with sciatica, left side: Secondary | ICD-10-CM | POA: Diagnosis not present

## 2020-08-31 DIAGNOSIS — M9902 Segmental and somatic dysfunction of thoracic region: Secondary | ICD-10-CM | POA: Diagnosis not present

## 2020-08-31 DIAGNOSIS — M9903 Segmental and somatic dysfunction of lumbar region: Secondary | ICD-10-CM | POA: Diagnosis not present

## 2020-09-04 DIAGNOSIS — M5442 Lumbago with sciatica, left side: Secondary | ICD-10-CM | POA: Diagnosis not present

## 2020-09-04 DIAGNOSIS — M9902 Segmental and somatic dysfunction of thoracic region: Secondary | ICD-10-CM | POA: Diagnosis not present

## 2020-09-04 DIAGNOSIS — M546 Pain in thoracic spine: Secondary | ICD-10-CM | POA: Diagnosis not present

## 2020-09-04 DIAGNOSIS — M9903 Segmental and somatic dysfunction of lumbar region: Secondary | ICD-10-CM | POA: Diagnosis not present

## 2020-09-07 DIAGNOSIS — M9903 Segmental and somatic dysfunction of lumbar region: Secondary | ICD-10-CM | POA: Diagnosis not present

## 2020-09-07 DIAGNOSIS — M5442 Lumbago with sciatica, left side: Secondary | ICD-10-CM | POA: Diagnosis not present

## 2020-09-07 DIAGNOSIS — M546 Pain in thoracic spine: Secondary | ICD-10-CM | POA: Diagnosis not present

## 2020-09-07 DIAGNOSIS — M9902 Segmental and somatic dysfunction of thoracic region: Secondary | ICD-10-CM | POA: Diagnosis not present

## 2020-09-09 ENCOUNTER — Ambulatory Visit (INDEPENDENT_AMBULATORY_CARE_PROVIDER_SITE_OTHER): Payer: BC Managed Care – PPO | Admitting: Adult Health

## 2020-09-09 ENCOUNTER — Encounter: Payer: Self-pay | Admitting: Adult Health

## 2020-09-09 ENCOUNTER — Other Ambulatory Visit: Payer: Self-pay

## 2020-09-09 ENCOUNTER — Encounter: Payer: BC Managed Care – PPO | Admitting: Nurse Practitioner

## 2020-09-09 VITALS — BP 101/62 | HR 72 | Ht 62.0 in | Wt 125.6 lb

## 2020-09-09 DIAGNOSIS — Z7689 Persons encountering health services in other specified circumstances: Secondary | ICD-10-CM

## 2020-09-09 DIAGNOSIS — Z319 Encounter for procreative management, unspecified: Secondary | ICD-10-CM

## 2020-09-09 NOTE — Progress Notes (Signed)
  Subjective:     Patient ID: Ashley Greene, female   DOB: Jan 04, 1991, 30 y.o.   MRN: [id number]  HPI Edla is a 30 year old white female,single with DP, G1P1 in to get established as a pt. She had a SVD in December 2021 and wants to get pregnant toward the end of summer. 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 She is seeing Dr Ladona Ridgel the chiropractor. She had normal pap in 2021 at [facility] she says  PCP is Kym Groom NP, who she just established care with this month.  Review of Systems  Denies any vaginal discharge or itching  Reviewed past medical,surgical, social and family history. Reviewed medications and allergies.     Objective:   Physical Exam BP 101/62 (BP Location: Right Arm, Patient Position: Sitting, Cuff Size: Normal)   Pulse 72   Ht 5\' 2"  (1.575 m)   Wt 125 lb 9.6 oz (57 kg)   LMP 09/05/2020 (Exact Date)   Breastfeeding Yes   BMI 22.97 kg/m  Skin warm and dry. Neck: mid line trachea, normal thyroid, good ROM, no lymphadenopathy noted. Lungs: clear to ausculation bilaterally. Cardiovascular: regular rate and rhythm. AA is 1 Fall risk is low PHQ 9 score is 0 GAD 7 score is 0  Upstream - 09/09/20 1127      Pregnancy Intention Screening   Does the patient want to become pregnant in the next year? Yes    Does the patient's partner want to become pregnant in the next year? Yes    Would the patient like to discuss contraceptive options today? No      Contraception Wrap Up   Current Method Pregnant/Seeking Pregnancy    End Method Pregnant/Seeking Pregnancy    Contraception Counseling Provided No             Assessment:       1. Encounter to establish care Physical in 1 year  2. Patient desires pregnancy Continue PNV     Plan:     Call with when pregnant

## 2020-09-28 ENCOUNTER — Encounter: Payer: BC Managed Care – PPO | Admitting: Nurse Practitioner

## 2020-10-07 ENCOUNTER — Encounter: Payer: Self-pay | Admitting: General Practice

## 2020-10-20 DIAGNOSIS — F4322 Adjustment disorder with anxiety: Secondary | ICD-10-CM | POA: Diagnosis not present

## 2020-12-04 DIAGNOSIS — M9904 Segmental and somatic dysfunction of sacral region: Secondary | ICD-10-CM | POA: Diagnosis not present

## 2020-12-04 DIAGNOSIS — M9901 Segmental and somatic dysfunction of cervical region: Secondary | ICD-10-CM | POA: Diagnosis not present

## 2020-12-04 DIAGNOSIS — M9905 Segmental and somatic dysfunction of pelvic region: Secondary | ICD-10-CM | POA: Diagnosis not present

## 2020-12-04 DIAGNOSIS — M9903 Segmental and somatic dysfunction of lumbar region: Secondary | ICD-10-CM | POA: Diagnosis not present

## 2020-12-08 DIAGNOSIS — M9905 Segmental and somatic dysfunction of pelvic region: Secondary | ICD-10-CM | POA: Diagnosis not present

## 2020-12-08 DIAGNOSIS — M9903 Segmental and somatic dysfunction of lumbar region: Secondary | ICD-10-CM | POA: Diagnosis not present

## 2020-12-08 DIAGNOSIS — M9904 Segmental and somatic dysfunction of sacral region: Secondary | ICD-10-CM | POA: Diagnosis not present

## 2020-12-08 DIAGNOSIS — M9901 Segmental and somatic dysfunction of cervical region: Secondary | ICD-10-CM | POA: Diagnosis not present

## 2020-12-09 DIAGNOSIS — M9904 Segmental and somatic dysfunction of sacral region: Secondary | ICD-10-CM | POA: Diagnosis not present

## 2020-12-09 DIAGNOSIS — M9905 Segmental and somatic dysfunction of pelvic region: Secondary | ICD-10-CM | POA: Diagnosis not present

## 2020-12-09 DIAGNOSIS — M9901 Segmental and somatic dysfunction of cervical region: Secondary | ICD-10-CM | POA: Diagnosis not present

## 2020-12-09 DIAGNOSIS — M9903 Segmental and somatic dysfunction of lumbar region: Secondary | ICD-10-CM | POA: Diagnosis not present

## 2020-12-10 DIAGNOSIS — M9903 Segmental and somatic dysfunction of lumbar region: Secondary | ICD-10-CM | POA: Diagnosis not present

## 2020-12-10 DIAGNOSIS — M9901 Segmental and somatic dysfunction of cervical region: Secondary | ICD-10-CM | POA: Diagnosis not present

## 2020-12-10 DIAGNOSIS — M9904 Segmental and somatic dysfunction of sacral region: Secondary | ICD-10-CM | POA: Diagnosis not present

## 2020-12-10 DIAGNOSIS — M9905 Segmental and somatic dysfunction of pelvic region: Secondary | ICD-10-CM | POA: Diagnosis not present

## 2020-12-14 DIAGNOSIS — M9904 Segmental and somatic dysfunction of sacral region: Secondary | ICD-10-CM | POA: Diagnosis not present

## 2020-12-14 DIAGNOSIS — M9903 Segmental and somatic dysfunction of lumbar region: Secondary | ICD-10-CM | POA: Diagnosis not present

## 2020-12-14 DIAGNOSIS — M9901 Segmental and somatic dysfunction of cervical region: Secondary | ICD-10-CM | POA: Diagnosis not present

## 2020-12-14 DIAGNOSIS — M9905 Segmental and somatic dysfunction of pelvic region: Secondary | ICD-10-CM | POA: Diagnosis not present

## 2020-12-15 ENCOUNTER — Ambulatory Visit
Admission: EM | Admit: 2020-12-15 | Discharge: 2020-12-15 | Disposition: A | Discharge status: Home / Self Care | Payer: BC Managed Care – PPO

## 2020-12-15 ENCOUNTER — Encounter: Payer: Self-pay | Admitting: Emergency Medicine

## 2020-12-15 ENCOUNTER — Other Ambulatory Visit: Payer: Self-pay

## 2020-12-15 DIAGNOSIS — R29898 Other symptoms and signs involving the musculoskeletal system: Secondary | ICD-10-CM

## 2020-12-15 NOTE — ED Provider Notes (Signed)
[facility]    CSN: [id number] Arrival date & time: 12/15/20  1850      History   Chief Complaint Chief Complaint  Patient presents with   Shoulder Pain    HPI Ashley Greene is a 30 y.o. female.   Pt reports she has been seeing a [facility].  Pt had neck adjustment yesterday,  Pt complains of weakness in her right arm.  Pt reports right shoulder/arm looks lower on the right side.  (Pt has not had this before)  The history is provided by the patient. No language interpreter was used.  Shoulder Pain Location:  Shoulder and arm Arm location:  R arm Pain details:    Quality:  Aching   Progression:  Worsening Dislocation: no   Relieved by:  Nothing Worsened by:  Nothing Associated symptoms: muscle weakness   Associated symptoms: no back pain    Past Medical History:  Diagnosis Date   Herpes simplex virus (HSV) infection    Medical history non-contributory    Positive GBS test 04/27/2020   Declines antibiotics, counseled   Post term pregnancy over 40 weeks 05/19/2020   Rubella immune 11/11/2019   Supervision of normal first pregnancy, antepartum 10/15/2019    Nursing Staff Provider Office Location [facility] Dating   LMP Language  English  Anatomy US   normal Flu Vaccine  Declined-11/11/19 Genetic Screen  NIPS: normal female AFP: negtive  First Screen:  Quad:   TDaP vaccine  03/17/20 Hgb A1C or  GTT Early  n/a Third trimester neg Rhogam  02/25/20   LAB RESULTS    Blood Type   ONeg Feeding Plan Breast  Antibody  Neg Contraception None Rubella   Immune Circumc    Patient Active Problem List   Diagnosis Date Noted   Patient desires pregnancy 09/09/2020   Encounter to establish care 08/25/2020   Back pain 08/25/2020   Rh negative state in antepartum period 11/11/2019   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 Authorizing Provider  ibuprofen (ADVIL) 800 MG tablet Take 1 tablet (800 mg total) by mouth every 8 (eight) hours as needed. 05/25/20   Gita Kudo, MD  Lidocaine (HM LIDOCAINE PATCH) 4 % PTCH Apply 1 each topically daily as needed. 08/25/20   Metzli Roberts, NP  Prenatal Vit-Fe Fumarate-FA (PRENATAL VITAMINS PO) Take by mouth.    [provider]    Family History Family History  Problem Relation Age of Onset   Healthy Mother    Healthy Father    Liver cancer Paternal Grandfather    Heart attack Paternal Grandmother    Stroke Paternal Grandmother    Dementia Paternal Grandmother    Bone cancer Maternal Grandfather    Healthy Half-Brother    Healthy Half-Sister     Social History Social History   Tobacco Use  Smoking status: Never   Smokeless tobacco: Never  Vaping Use   Vaping Use: Never used  Substance Use Topics   Alcohol use: No   Drug use: No     Allergies   Amoxicillin and Valtrex [valacyclovir]   Review of Systems Review of Systems  Musculoskeletal:  Negative for back pain.  All other systems reviewed and are negative.   Physical Exam Triage Vital Signs ED Triage Vitals  Enc Vitals Group     BP 12/15/20 1930 114/76     Pulse Rate 12/15/20 1930 (!) 118     Resp 12/15/20 1930 17     Temp 12/15/20 1930 99 F (37.2 C)     Temp Source 12/15/20 1930 Oral     SpO2 12/15/20 1930 97 %     Weight --      Height --      Head Circumference --      Peak Flow --      Pain Score 12/15/20 1928 3     Pain Loc --      Pain Edu? --      Excl. in GC? --    No data found.  Updated Vital Signs BP 114/76 (BP Location: Right Arm)   Pulse (!) 118   Temp 99 F (37.2 C) (Oral)   Resp 17   LMP 12/10/2020   SpO2 97%   Visual Acuity Right Eye  Distance:   Left Eye Distance:   Bilateral Distance:    Right Eye Near:   Left Eye Near:    Bilateral Near:     Physical Exam Vitals reviewed.  Constitutional:      Appearance: Normal appearance.  HENT:     Head: Normocephalic and atraumatic.  Cardiovascular:     Rate and Rhythm: Normal rate.     Pulses: Normal pulses.  Pulmonary:     Effort: Pulmonary effort is normal.  Abdominal:     General: Abdomen is flat.  Musculoskeletal:        General: No swelling.     Comments: Right shoulder lower, trapezius higher on left side,  from  nv and ns intact   Skin:    General: Skin is warm.  Neurological:     General: No focal deficit present.     Mental Status: She is alert.     UC Treatments / Results  Labs (all labs ordered are listed, but only abnormal results are displayed) Labs Reviewed - No data to display  EKG   Radiology No results found.  Procedures Procedures (including critical care time)  Medications Ordered in UC Medications - No data to display  Initial Impression / Assessment and Plan / UC Course  I have reviewed the triage vital signs and the nursing notes.  Pertinent labs & imaging results that were available during my care of the patient were reviewed by me and considered in my medical decision making (see chart for details).     MDM:  I am concerned pt has weakness after neck adjustment.  Pt advised to go to ED to be seen.  Final Clinical Impressions(s) / UC Diagnoses   Final diagnoses:  Right arm weakness     Discharge Instructions      Go to the Emergency department to be seen    ED Prescriptions   None    PDMP not reviewed this encounter.   Elson Areas, [place] 12/15/20 2011

## 2020-12-15 NOTE — Discharge Instructions (Addendum)
Go to the Emergency department to be seen  

## 2020-12-15 NOTE — ED Triage Notes (Signed)
Shoulder pain that shoots down RT arm x 1 hour ago , denies any injury.  Also reports she had some sharp pains at the top of her abd and LLQ earlier, the pains have subsided at this time.

## 2020-12-18 DIAGNOSIS — M9904 Segmental and somatic dysfunction of sacral region: Secondary | ICD-10-CM | POA: Diagnosis not present

## 2020-12-18 DIAGNOSIS — M9903 Segmental and somatic dysfunction of lumbar region: Secondary | ICD-10-CM | POA: Diagnosis not present

## 2020-12-18 DIAGNOSIS — M9901 Segmental and somatic dysfunction of cervical region: Secondary | ICD-10-CM | POA: Diagnosis not present

## 2020-12-18 DIAGNOSIS — M9905 Segmental and somatic dysfunction of pelvic region: Secondary | ICD-10-CM | POA: Diagnosis not present

## 2020-12-22 DIAGNOSIS — M9903 Segmental and somatic dysfunction of lumbar region: Secondary | ICD-10-CM | POA: Diagnosis not present

## 2020-12-22 DIAGNOSIS — M9901 Segmental and somatic dysfunction of cervical region: Secondary | ICD-10-CM | POA: Diagnosis not present

## 2020-12-22 DIAGNOSIS — M9904 Segmental and somatic dysfunction of sacral region: Secondary | ICD-10-CM | POA: Diagnosis not present

## 2020-12-22 DIAGNOSIS — M9905 Segmental and somatic dysfunction of pelvic region: Secondary | ICD-10-CM | POA: Diagnosis not present

## 2020-12-23 DIAGNOSIS — M9905 Segmental and somatic dysfunction of pelvic region: Secondary | ICD-10-CM | POA: Diagnosis not present

## 2020-12-23 DIAGNOSIS — M9904 Segmental and somatic dysfunction of sacral region: Secondary | ICD-10-CM | POA: Diagnosis not present

## 2020-12-23 DIAGNOSIS — M9903 Segmental and somatic dysfunction of lumbar region: Secondary | ICD-10-CM | POA: Diagnosis not present

## 2020-12-23 DIAGNOSIS — M9901 Segmental and somatic dysfunction of cervical region: Secondary | ICD-10-CM | POA: Diagnosis not present

## 2020-12-24 DIAGNOSIS — M9901 Segmental and somatic dysfunction of cervical region: Secondary | ICD-10-CM | POA: Diagnosis not present

## 2020-12-24 DIAGNOSIS — M9905 Segmental and somatic dysfunction of pelvic region: Secondary | ICD-10-CM | POA: Diagnosis not present

## 2020-12-24 DIAGNOSIS — M9904 Segmental and somatic dysfunction of sacral region: Secondary | ICD-10-CM | POA: Diagnosis not present

## 2020-12-24 DIAGNOSIS — M9903 Segmental and somatic dysfunction of lumbar region: Secondary | ICD-10-CM | POA: Diagnosis not present

## 2020-12-28 DIAGNOSIS — M9903 Segmental and somatic dysfunction of lumbar region: Secondary | ICD-10-CM | POA: Diagnosis not present

## 2020-12-28 DIAGNOSIS — M9901 Segmental and somatic dysfunction of cervical region: Secondary | ICD-10-CM | POA: Diagnosis not present

## 2020-12-28 DIAGNOSIS — M9904 Segmental and somatic dysfunction of sacral region: Secondary | ICD-10-CM | POA: Diagnosis not present

## 2020-12-28 DIAGNOSIS — M9905 Segmental and somatic dysfunction of pelvic region: Secondary | ICD-10-CM | POA: Diagnosis not present

## 2020-12-30 DIAGNOSIS — M9901 Segmental and somatic dysfunction of cervical region: Secondary | ICD-10-CM | POA: Diagnosis not present

## 2020-12-30 DIAGNOSIS — M9905 Segmental and somatic dysfunction of pelvic region: Secondary | ICD-10-CM | POA: Diagnosis not present

## 2020-12-30 DIAGNOSIS — M9904 Segmental and somatic dysfunction of sacral region: Secondary | ICD-10-CM | POA: Diagnosis not present

## 2020-12-30 DIAGNOSIS — M9903 Segmental and somatic dysfunction of lumbar region: Secondary | ICD-10-CM | POA: Diagnosis not present

## 2021-01-01 DIAGNOSIS — M9905 Segmental and somatic dysfunction of pelvic region: Secondary | ICD-10-CM | POA: Diagnosis not present

## 2021-01-01 DIAGNOSIS — M9903 Segmental and somatic dysfunction of lumbar region: Secondary | ICD-10-CM | POA: Diagnosis not present

## 2021-01-01 DIAGNOSIS — M9901 Segmental and somatic dysfunction of cervical region: Secondary | ICD-10-CM | POA: Diagnosis not present

## 2021-01-01 DIAGNOSIS — M9904 Segmental and somatic dysfunction of sacral region: Secondary | ICD-10-CM | POA: Diagnosis not present

## 2021-01-04 DIAGNOSIS — M9903 Segmental and somatic dysfunction of lumbar region: Secondary | ICD-10-CM | POA: Diagnosis not present

## 2021-01-04 DIAGNOSIS — M9904 Segmental and somatic dysfunction of sacral region: Secondary | ICD-10-CM | POA: Diagnosis not present

## 2021-01-04 DIAGNOSIS — M9905 Segmental and somatic dysfunction of pelvic region: Secondary | ICD-10-CM | POA: Diagnosis not present

## 2021-01-04 DIAGNOSIS — M9901 Segmental and somatic dysfunction of cervical region: Secondary | ICD-10-CM | POA: Diagnosis not present

## 2021-01-05 DIAGNOSIS — M9903 Segmental and somatic dysfunction of lumbar region: Secondary | ICD-10-CM | POA: Diagnosis not present

## 2021-01-05 DIAGNOSIS — M9905 Segmental and somatic dysfunction of pelvic region: Secondary | ICD-10-CM | POA: Diagnosis not present

## 2021-01-05 DIAGNOSIS — M9901 Segmental and somatic dysfunction of cervical region: Secondary | ICD-10-CM | POA: Diagnosis not present

## 2021-01-05 DIAGNOSIS — M9904 Segmental and somatic dysfunction of sacral region: Secondary | ICD-10-CM | POA: Diagnosis not present

## 2021-01-06 DIAGNOSIS — M9905 Segmental and somatic dysfunction of pelvic region: Secondary | ICD-10-CM | POA: Diagnosis not present

## 2021-01-06 DIAGNOSIS — M9901 Segmental and somatic dysfunction of cervical region: Secondary | ICD-10-CM | POA: Diagnosis not present

## 2021-01-06 DIAGNOSIS — M9903 Segmental and somatic dysfunction of lumbar region: Secondary | ICD-10-CM | POA: Diagnosis not present

## 2021-01-06 DIAGNOSIS — M9904 Segmental and somatic dysfunction of sacral region: Secondary | ICD-10-CM | POA: Diagnosis not present

## 2021-01-21 ENCOUNTER — Encounter (HOSPITAL_COMMUNITY): Payer: Self-pay

## 2021-01-21 ENCOUNTER — Emergency Department (HOSPITAL_COMMUNITY)
Admission: EM | Admit: 2021-01-21 | Discharge: 2021-01-21 | Disposition: A | Discharge status: Home / Self Care | Payer: BC Managed Care – PPO | Attending: Emergency Medicine | Admitting: Emergency Medicine

## 2021-01-21 ENCOUNTER — Other Ambulatory Visit: Payer: Self-pay

## 2021-01-21 DIAGNOSIS — S90561A Insect bite (nonvenomous), right ankle, initial encounter: Secondary | ICD-10-CM | POA: Insufficient documentation

## 2021-01-21 DIAGNOSIS — Z5321 Procedure and treatment not carried out due to patient leaving prior to being seen by health care provider: Secondary | ICD-10-CM | POA: Insufficient documentation

## 2021-01-21 DIAGNOSIS — W57XXXA Bitten or stung by nonvenomous insect and other nonvenomous arthropods, initial encounter: Secondary | ICD-10-CM | POA: Diagnosis not present

## 2021-01-21 NOTE — ED Triage Notes (Signed)
Pt has "insect bite" to right medial ankle. Area is red, pt says it is stinging and painful.

## 2021-01-22 ENCOUNTER — Ambulatory Visit
Admission: EM | Admit: 2021-01-22 | Discharge: 2021-01-22 | Disposition: A | Discharge status: Home / Self Care | Payer: BC Managed Care – PPO | Attending: Emergency Medicine | Admitting: Emergency Medicine

## 2021-01-22 ENCOUNTER — Other Ambulatory Visit: Payer: Self-pay

## 2021-01-22 ENCOUNTER — Ambulatory Visit
Admission: EM | Admit: 2021-01-22 | Discharge: 2021-01-22 | Disposition: A | Discharge status: Home / Self Care | Payer: BC Managed Care – PPO

## 2021-01-22 VITALS — BP 107/71 | HR 73 | Temp 98.1°F | Resp 14

## 2021-01-22 DIAGNOSIS — W57XXXA Bitten or stung by nonvenomous insect and other nonvenomous arthropods, initial encounter: Secondary | ICD-10-CM

## 2021-01-22 DIAGNOSIS — S90561A Insect bite (nonvenomous), right ankle, initial encounter: Secondary | ICD-10-CM | POA: Diagnosis not present

## 2021-01-22 MED ORDER — MUPIROCIN 2 % EX OINT: 1.0000 "application " | TOPICAL_OINTMENT | Freq: Two times a day (BID) | CUTANEOUS | 0 refills | Status: DC

## 2021-01-22 MED ORDER — TRIAMCINOLONE ACETONIDE 0.1 % EX CREA: 1.0000 "application " | TOPICAL_CREAM | Freq: Two times a day (BID) | CUTANEOUS | 0 refills | Status: DC

## 2021-01-22 NOTE — Discharge Instructions (Addendum)
Wash site daily with warm water and mild soap Triamcinolone cream for itching Bactroban ointment prescribed for infection Follow up here or with PCP if symptoms persists Return or go to the ED if you have any new or worsening symptoms increased redness, swelling, pain, nausea, vomiting, fever, chills, etc..Marland Kitchen

## 2021-01-22 NOTE — ED Triage Notes (Signed)
Pt has "insect bite" to right medial ankle. Area is red, pt says it is stinging and painful. 

## 2021-01-22 NOTE — ED Provider Notes (Signed)
[facility]   [id number] 01/22/21 Arrival Time: 1248   CC: Insect bite  SUBJECTIVE:  Yicel Shannon is a 30 y.o. female who presents with a possible insect bite of her RT foot that occurred last night. Unsure what bit her.  Has been icing and apply cream with relief.  Sore to the touch.  Denies similar symptoms in the past.  Denies fever, chills, nausea, vomiting.    ROS: As per HPI.  All other pertinent ROS negative.     Past Medical History:  Diagnosis Date   Herpes simplex virus (HSV) infection    Medical history non-contributory    Positive GBS test 04/27/2020   Declines antibiotics, counseled   Post term pregnancy over 40 weeks 05/19/2020   Rubella immune 11/11/2019   Supervision of normal first pregnancy, antepartum 10/15/2019    Nursing Staff Provider Office Location [facility] Dating   LMP Language  English  Anatomy US   normal Flu Vaccine  Declined-11/11/19 Genetic Screen  NIPS: normal female AFP: negtive  First Screen:  Quad:   TDaP vaccine  03/17/20 Hgb A1C or  GTT Early  n/a Third trimester neg Rhogam  02/25/20   LAB RESULTS    Blood Type   ONeg Feeding Plan Breast  Antibody  Neg Contraception None Rubella   Immune Circumc   Past Surgical History:  Procedure Laterality Date   WISDOM TOOTH EXTRACTION     Allergies  Allergen Reactions   Amoxicillin Diarrhea    Stomach upset    Valtrex [Valacyclovir]     Stomach upset    No current facility-administered medications on file prior to encounter.   Current Outpatient Medications on File Prior to Encounter  Medication Sig Dispense Refill   ibuprofen (ADVIL) 800 MG tablet Take 1 tablet (800 mg total) by mouth every 8 (eight) hours as needed. 30 tablet 0   Lidocaine (HM LIDOCAINE PATCH) 4 % PTCH Apply 1 each topically daily as needed. 15 patch 1   Prenatal Vit-Fe Fumarate-FA (PRENATAL VITAMINS PO) Take by mouth.     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  Tobacco Use   Smoking status: Never   Smokeless tobacco: Never  Vaping Use   Vaping Use: Never used  Substance and Sexual Activity   Alcohol use: No   Drug use: No   Sexual activity: Yes    Birth control/protection: None  Other Topics Concern   Not on file  Social History Narrative   Not on file   Social Determinants of Health   Financial Resource Strain: Low Risk    Difficulty of Paying Living Expenses: Not hard at all  Food Insecurity: No Food Insecurity   Worried About Programme researcher, broadcasting/film/video in the Last Year: Never true   Ran Out of Food in the Last Year: Never true  Transportation Needs: No Transportation Needs   Lack of Transportation (Medical): No   Lack of Transportation (Non-Medical): No  Physical Activity: Insufficiently Active   Days of Exercise per Week: 2 days   Minutes of Exercise per Session: 60 min  Stress: No Stress Concern Present   Feeling of Stress : Not at all  Social Connections: Unknown  Frequency of Communication with Friends and Family: More than three times a week   Frequency of Social Gatherings with Friends and Family: More than three times a week   Attends Religious Services: Patient refused   Database administrator or Organizations: Yes   Attends Engineer, structural: More than 4 times per year   Marital Status: Living with partner  Intimate Partner Violence: Not At Risk   Fear of Current or Ex-Partner: No   Emotionally Abused: No   Physically Abused: No   Sexually Abused: No   Family History  Problem Relation Age of Onset   Healthy Mother    Healthy Father    Liver cancer Paternal Grandfather    Heart attack Paternal Grandmother    Stroke Paternal Grandmother    Dementia Paternal Grandmother    Bone cancer Maternal Grandfather     Healthy Half-Brother    Healthy Half-Sister     OBJECTIVE:  Vitals:   01/22/21 1314  BP: 107/71  Pulse: 73  Resp: 14  Temp: 98.1 F (36.7 C)  TempSrc: Tympanic  SpO2: 98%     General appearance: alert; no distress CV: dorsalis pedis pulse 2+ Skin: 0.5-1 cm induration of her LT proximal medial foot, subtle overlying erythema; tender to touch; no active drainage Psychological: alert and cooperative; normal mood and affect   ASSESSMENT & PLAN:  1. Insect bite of right ankle, initial encounter     Meds ordered this encounter  Medications   triamcinolone cream (KENALOG) 0.1 %    Sig: Apply 1 application topically 2 (two) times daily.    Dispense:  30 g    Refill:  0    Order Specific Question:   Supervising Provider    Answer:   Eustace Moore [[id number]]   mupirocin ointment (BACTROBAN) 2 %    Sig: Apply 1 application topically 2 (two) times daily.    Dispense:  22 g    Refill:  0    Order Specific Question:   Supervising Provider    Answer:   Eustace Moore [[id number]]    Wash site daily with warm water and mild soap Triamcinolone cream for itching Bactroban ointment prescribed for infection Follow up here or with PCP if symptoms persists Return or go to the ED if you have any new or worsening symptoms increased redness, swelling, pain, nausea, vomiting, fever, chills, etc...   Reviewed expectations re: course of current medical issues. Questions answered. Outlined signs and symptoms indicating need for more acute intervention. Patient verbalized understanding. After Visit Summary given.           Rennis Harding, PA-C 01/22/21 1340

## 2021-02-08 DIAGNOSIS — M9903 Segmental and somatic dysfunction of lumbar region: Secondary | ICD-10-CM | POA: Diagnosis not present

## 2021-02-08 DIAGNOSIS — M9904 Segmental and somatic dysfunction of sacral region: Secondary | ICD-10-CM | POA: Diagnosis not present

## 2021-02-08 DIAGNOSIS — M9905 Segmental and somatic dysfunction of pelvic region: Secondary | ICD-10-CM | POA: Diagnosis not present

## 2021-02-08 DIAGNOSIS — M9901 Segmental and somatic dysfunction of cervical region: Secondary | ICD-10-CM | POA: Diagnosis not present

## 2021-03-07 ENCOUNTER — Emergency Department (HOSPITAL_COMMUNITY)
Admission: EM | Admit: 2021-03-07 | Discharge: 2021-03-07 | Disposition: A | Discharge status: Home / Self Care | Payer: BC Managed Care – PPO | Attending: Emergency Medicine | Admitting: Emergency Medicine

## 2021-03-07 ENCOUNTER — Other Ambulatory Visit: Payer: Self-pay

## 2021-03-07 ENCOUNTER — Encounter (HOSPITAL_COMMUNITY): Payer: Self-pay | Admitting: Emergency Medicine

## 2021-03-07 DIAGNOSIS — S025XXB Fracture of tooth (traumatic), initial encounter for open fracture: Secondary | ICD-10-CM | POA: Insufficient documentation

## 2021-03-07 DIAGNOSIS — S025XXA Fracture of tooth (traumatic), initial encounter for closed fracture: Secondary | ICD-10-CM | POA: Diagnosis not present
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
## 2021-03-07 NOTE — ED Notes (Signed)
Patient left on own accord °

## 2021-03-11 ENCOUNTER — Ambulatory Visit: Payer: BC Managed Care – PPO

## 2021-04-01 DIAGNOSIS — F4323 Adjustment disorder with mixed anxiety and depressed mood: Secondary | ICD-10-CM | POA: Diagnosis not present

## 2021-04-13 DIAGNOSIS — F4323 Adjustment disorder with mixed anxiety and depressed mood: Secondary | ICD-10-CM | POA: Diagnosis not present

## 2021-04-29 DIAGNOSIS — F4323 Adjustment disorder with mixed anxiety and depressed mood: Secondary | ICD-10-CM | POA: Diagnosis not present

## 2021-05-17 DIAGNOSIS — F4323 Adjustment disorder with mixed anxiety and depressed mood: Secondary | ICD-10-CM | POA: Diagnosis not present

## 2021-05-20 DIAGNOSIS — F4323 Adjustment disorder with mixed anxiety and depressed mood: Secondary | ICD-10-CM | POA: Diagnosis not present

## 2021-06-22 DIAGNOSIS — F4323 Adjustment disorder with mixed anxiety and depressed mood: Secondary | ICD-10-CM | POA: Diagnosis not present

## 2021-07-06 DIAGNOSIS — F4323 Adjustment disorder with mixed anxiety and depressed mood: Secondary | ICD-10-CM | POA: Diagnosis not present

## 2021-07-13 IMAGING — DX DG CHEST 1V
1 series · 1 of 1 positions shown · non-contrast
Comparison: None.

CLINICAL DATA: Dyspnea

EXAM:
CHEST  1 VIEW

[chest ap]
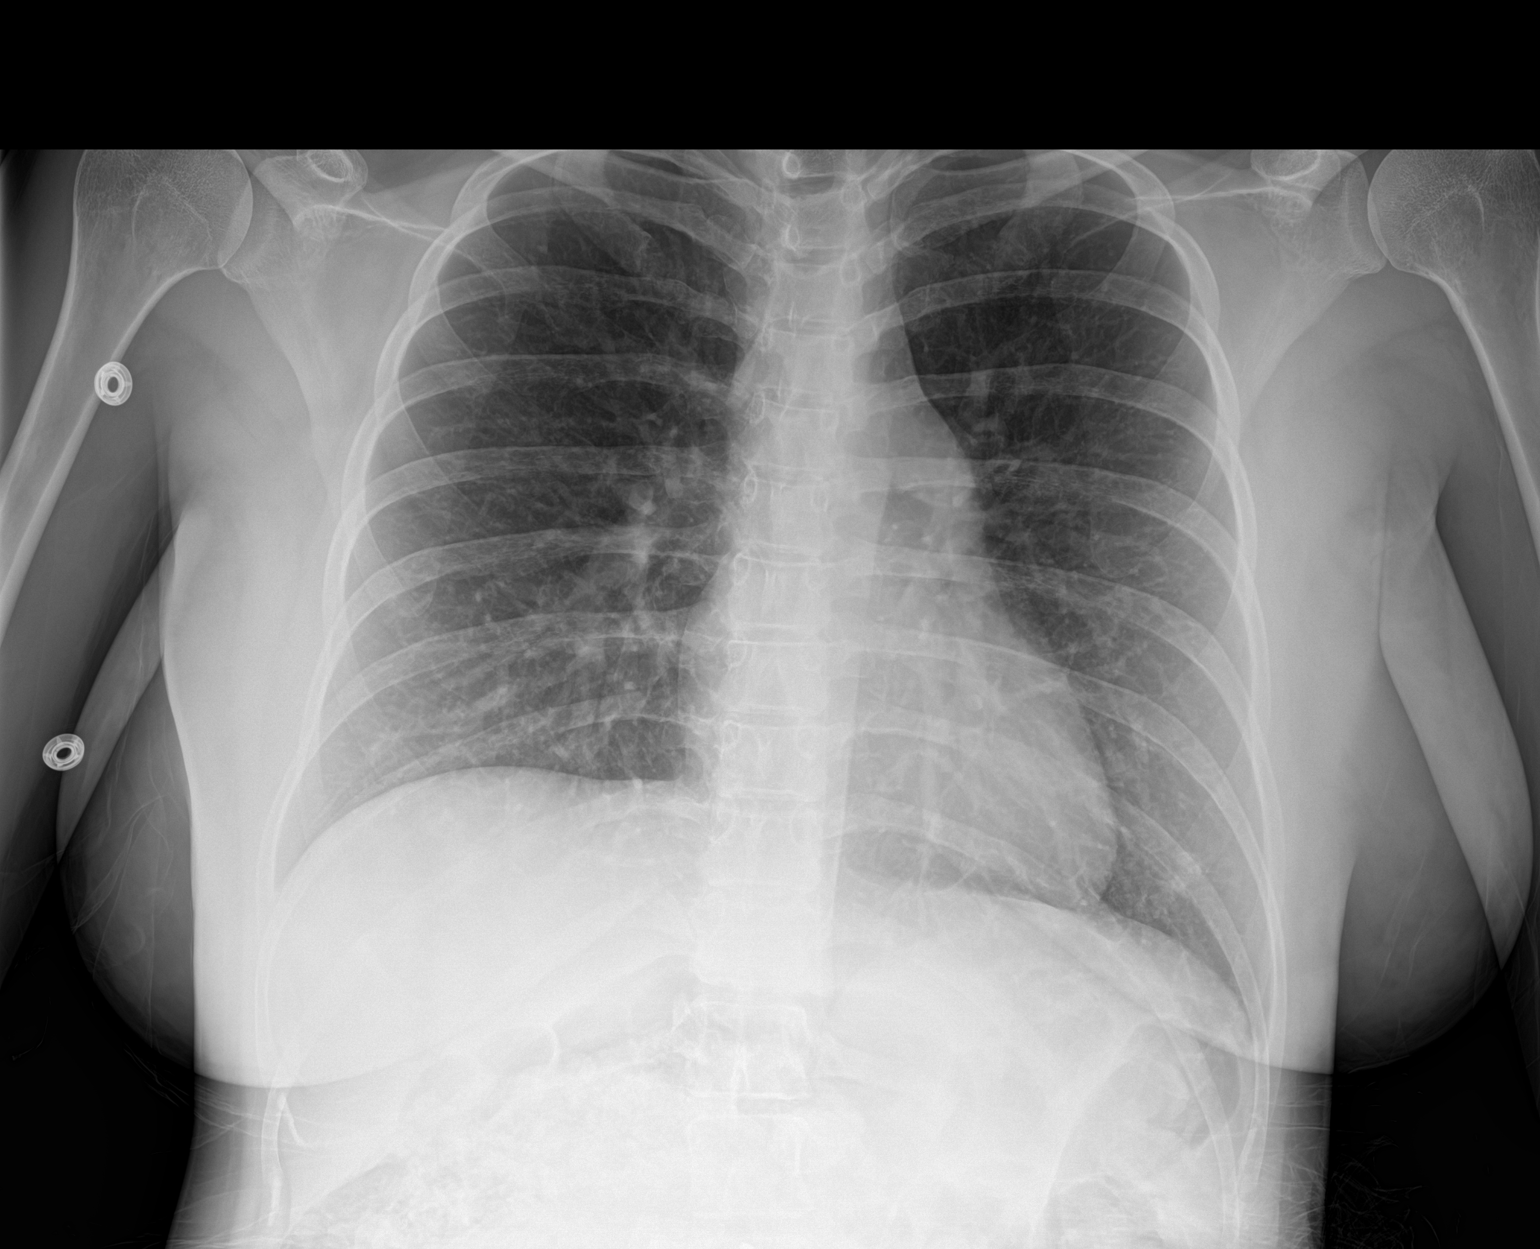

[1 of 1 positions shown; findings below may reference images not displayed]

FINDINGS: The heart size and mediastinal contours are within normal limits.
Both lungs are clear. The visualized skeletal structures are
unremarkable.
IMPRESSION: No active disease.

## 2021-07-22 DIAGNOSIS — F4323 Adjustment disorder with mixed anxiety and depressed mood: Secondary | ICD-10-CM | POA: Diagnosis not present

## 2021-09-04 IMAGING — US US FETAL BPP W/ NON-STRESS
1 series · 12 of 12 positions shown · non-contrast
Comparison: none

[Series 1: us fetal bpp w/ non-stress · 12 acquisitions, 12 frames shown]
[im 1/12]
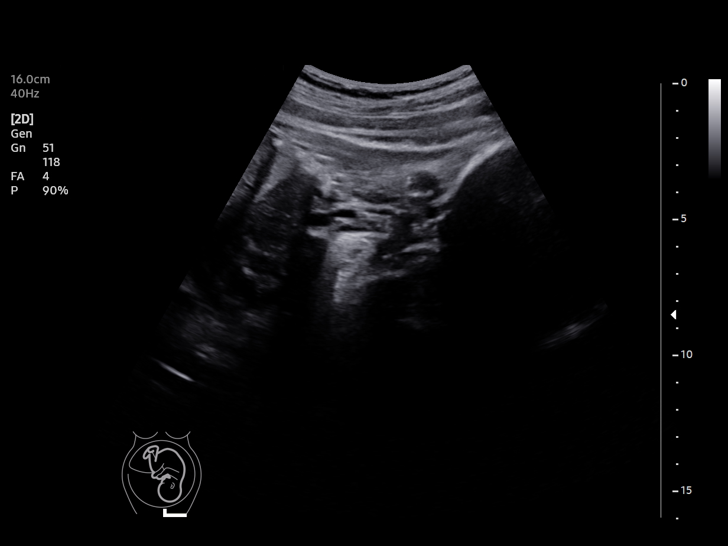
[im 2/12]
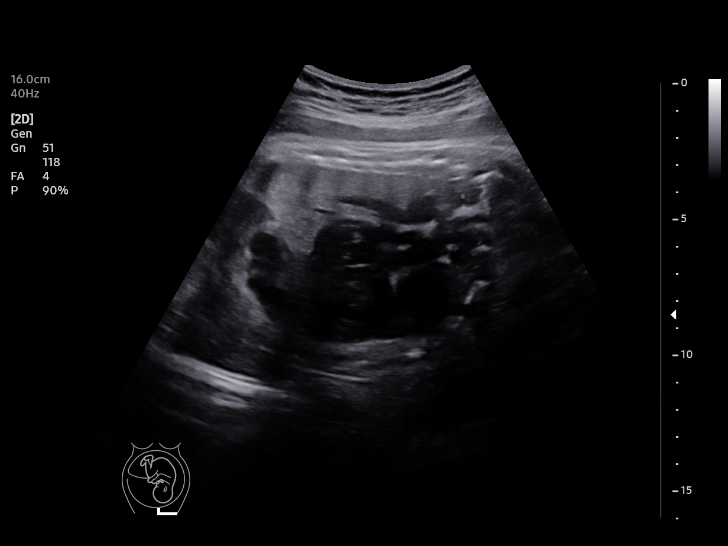
[im 3/12]
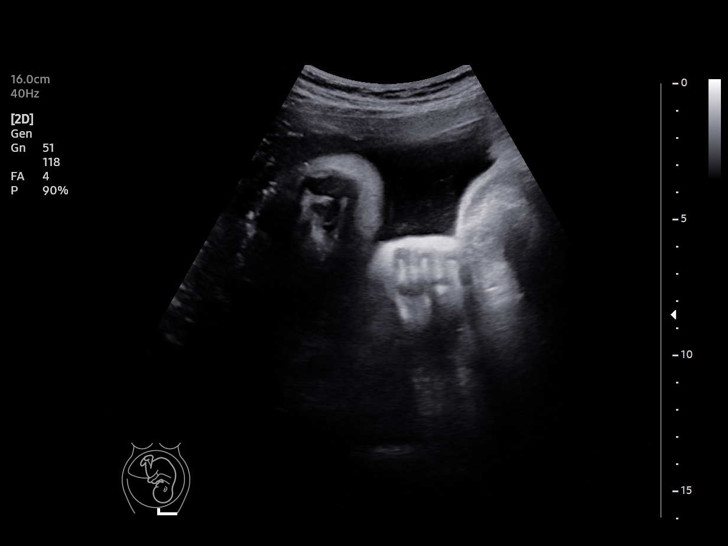
[im 4/12]
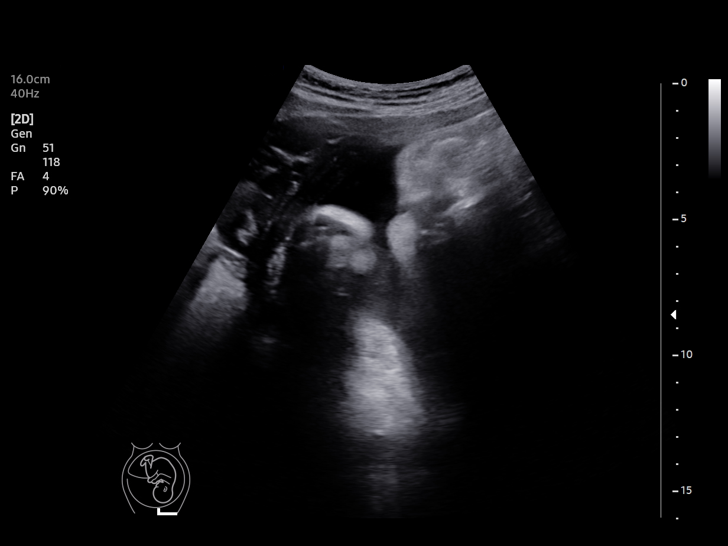
[im 5/12]
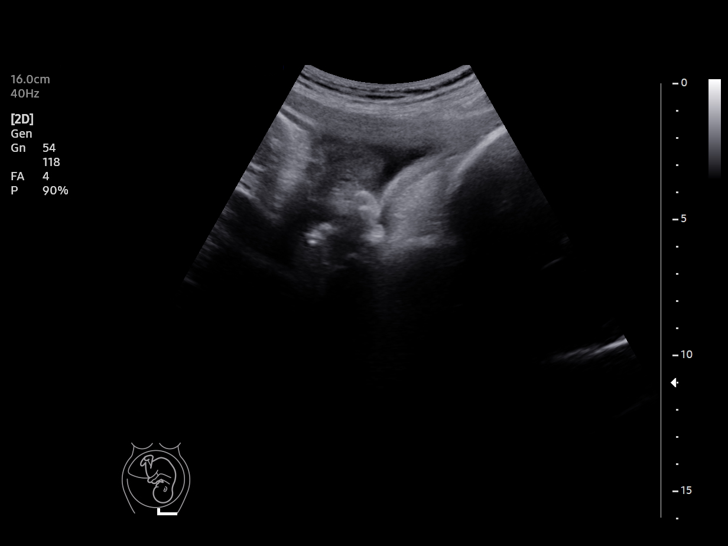
[im 6/12]
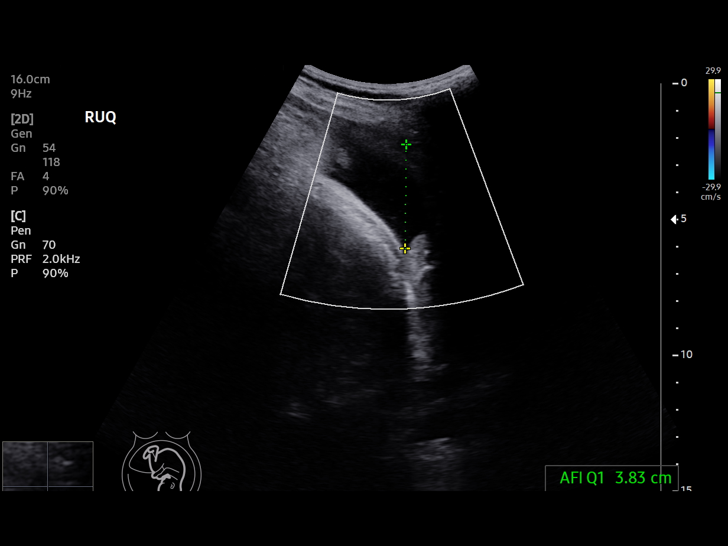
[im 7/12]
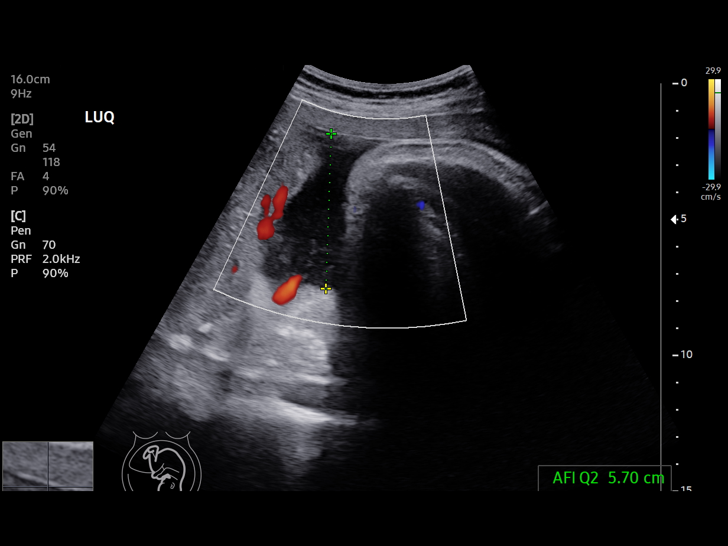
[im 8/12]
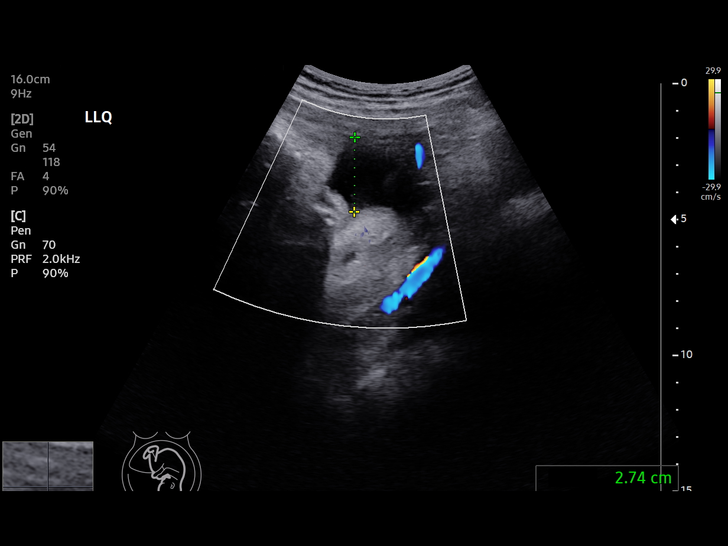
[im 9/12]
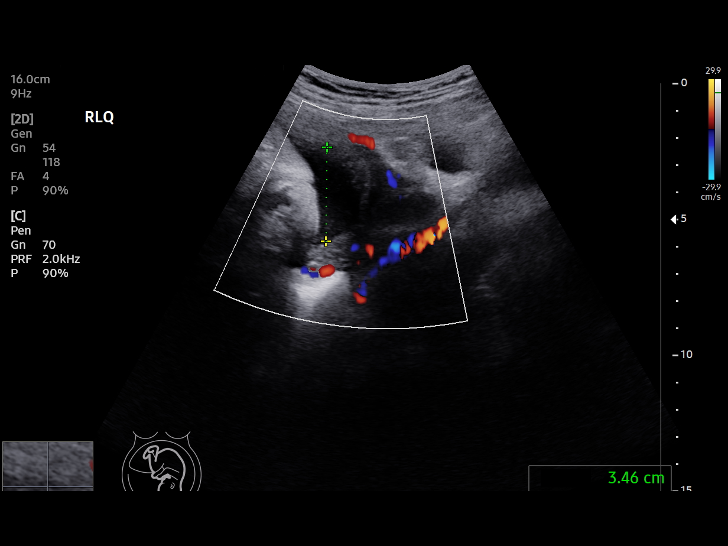
[im 10/12]
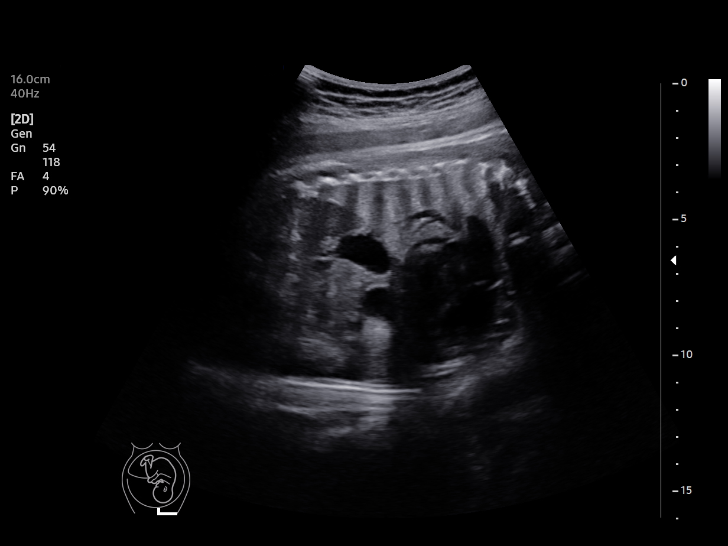
[im 11/12]
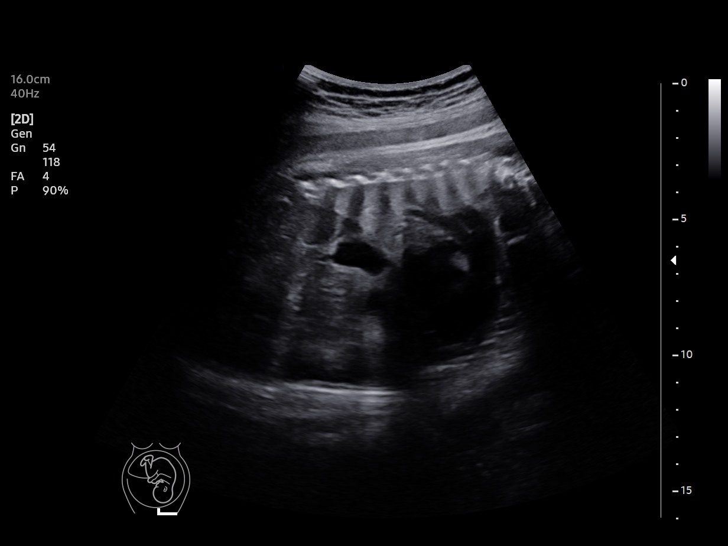
[im 12/12]
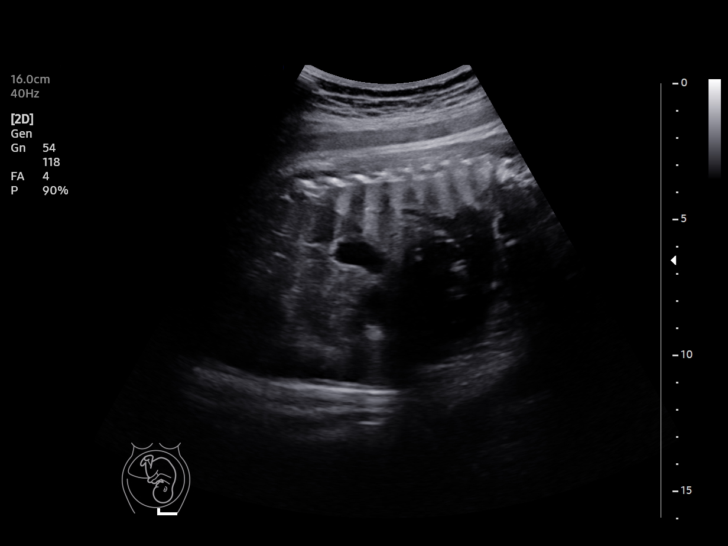

[12 of 12 positions shown; findings below may reference images not displayed]

[REDACTED]care at

Service(s) Provided

Indications

 40 weeks gestation of pregnancy
 Postdate pregnancy (40-42 weeks)
Fetal Evaluation

 Num Of Fetuses:         1
 Preg. Location:         Intrauterine
 Cardiac Activity:       Observed
 Presentation:           Cephalic

 Amniotic Fluid
 AFI FV:      Within normal limits

 AFI Sum(cm)     %Tile       Largest Pocket(cm)
 15.7            69

 RUQ(cm)       RLQ(cm)       LUQ(cm)        LLQ(cm)


 Comment:    BPP [DATE]
Biophysical Evaluation
 Amniotic F.V:   Pocket => 2 cm             F. Tone:        Observed
 F. Movement:    Observed                   N.S.T:          Reactive
 F. Breathing:   Observed                   Score:          [DATE]
OB History

 Gravidity:    1         Term:   0        Prem:   0        SAB:   0
 TOP:          0       Ectopic:  0        Living: 0
Gestational Age

 LMP:           40w 3d        Date:  08/09/19                 EDD:   05/15/20
 Best:          40w 3d     Det. By:  LMP  (08/09/19)          EDD:   05/15/20

## 2021-11-25 DIAGNOSIS — F4323 Adjustment disorder with mixed anxiety and depressed mood: Secondary | ICD-10-CM | POA: Diagnosis not present

## 2021-12-09 ENCOUNTER — Ambulatory Visit: Payer: BC Managed Care – PPO | Admitting: Nurse Practitioner

## 2021-12-09 ENCOUNTER — Encounter: Payer: Self-pay | Admitting: Nurse Practitioner

## 2021-12-09 DIAGNOSIS — R21 Rash and other nonspecific skin eruption: Secondary | ICD-10-CM

## 2021-12-09 DIAGNOSIS — B009 Herpesviral infection, unspecified: Secondary | ICD-10-CM | POA: Diagnosis not present

## 2021-12-09 MED ORDER — ACYCLOVIR 400 MG PO TABS: 400.0000 mg | ORAL_TABLET | Freq: Two times a day (BID) | ORAL | 6 refills | Status: DC

## 2021-12-09 MED ORDER — BETAMETHASONE DIPROPIONATE AUG 0.05 % EX CREA: TOPICAL_CREAM | Freq: Two times a day (BID) | CUTANEOUS | 1 refills | Status: DC

## 2021-12-09 NOTE — Assessment & Plan Note (Addendum)
Betamethasone dipropionate 0.05% refilled.  Applied 2 times daily to affected areas

## 2021-12-09 NOTE — Assessment & Plan Note (Addendum)
Uses acyclovir for outbreaks  Had recent out break of HSV2 a week ago  Acyclovir refilled , take acyclovir 400mg  BID for suppression of HSV2

## 2021-12-09 NOTE — Progress Notes (Signed)
Virtual Visit via Telephone Note  I connected with Zalayah Gladden @ on 12/09/21 at 1200pm by telephone and verified that I am speaking with the correct person using two identifiers.  I spent several minutes on this telephone encounter.  Location: Patient: home Provider: office   I discussed the limitations, risks, security and privacy concerns of performing an evaluation and management service by telephone and the availability of in person appointments. I also discussed with the patient that there may be a patient responsible charge related to this service. The patient expressed understanding and agreed to proceed.   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  Patient stated that she had herpez outbreak in the genital areas about a week ago, she takes acyclovir 400 mg 2 times daily to prevent herpes outbreak but she has been out of the medication, patient denies denies fever chills, body aches   Has intermittent outbreaks of red rashes on her thighs, lower back, she has seen a dermatologist in the past about her condition, uses betamethasone cream as needed. patient stated that the rashes does not itch, nor drain.  Betamethasone cream has been effective    Observations/Objective:   Assessment and Plan: HSV-2 infection Uses acyclovir for outbreaks  Had recent out break of HSV2 a week ago  Acyclovir refilled , take acyclovir 400mg  BID for suppression of HSV2   Rash and other nonspecific skin eruption Betamethasone dipropionate 0.05% refilled.  Applied 2 times daily to affected areas   Follow Up Instructions:    I discussed the assessment and treatment plan with the patient. The patient was provided an opportunity to ask questions and all were answered. The patient agreed with the plan and demonstrated an understanding of the instructions.   The patient was advised to call back or seek an in-person  evaluation if the symptoms worsen or if the condition fails to improve as anticipated.

## 2021-12-23 DIAGNOSIS — F4323 Adjustment disorder with mixed anxiety and depressed mood: Secondary | ICD-10-CM | POA: Diagnosis not present

## 2022-01-03 DIAGNOSIS — F4323 Adjustment disorder with mixed anxiety and depressed mood: Secondary | ICD-10-CM | POA: Diagnosis not present

## 2022-01-24 DIAGNOSIS — F4323 Adjustment disorder with mixed anxiety and depressed mood: Secondary | ICD-10-CM | POA: Diagnosis not present

## 2022-02-13 ENCOUNTER — Other Ambulatory Visit: Payer: Self-pay

## 2022-02-13 ENCOUNTER — Emergency Department (HOSPITAL_COMMUNITY): Payer: BC Managed Care – PPO

## 2022-02-13 ENCOUNTER — Encounter (HOSPITAL_COMMUNITY): Payer: Self-pay | Admitting: Emergency Medicine

## 2022-02-13 ENCOUNTER — Emergency Department (HOSPITAL_COMMUNITY)
Admission: EM | Admit: 2022-02-13 | Discharge: 2022-02-13 | Disposition: A | Discharge status: Home / Self Care | Payer: BC Managed Care – PPO | Attending: Emergency Medicine | Admitting: Emergency Medicine

## 2022-02-13 DIAGNOSIS — W01198A Fall on same level from slipping, tripping and stumbling with subsequent striking against other object, initial encounter: Secondary | ICD-10-CM | POA: Diagnosis not present

## 2022-02-13 DIAGNOSIS — S0541XA Penetrating wound of orbit with or without foreign body, right eye, initial encounter: Secondary | ICD-10-CM | POA: Insufficient documentation

## 2022-02-13 DIAGNOSIS — Y9359 Activity, other involving other sports and athletics played individually: Secondary | ICD-10-CM | POA: Diagnosis not present

## 2022-02-13 DIAGNOSIS — S01411A Laceration without foreign body of right cheek and temporomandibular area, initial encounter: Secondary | ICD-10-CM | POA: Diagnosis not present

## 2022-02-13 DIAGNOSIS — S0181XA Laceration without foreign body of other part of head, initial encounter: Secondary | ICD-10-CM | POA: Diagnosis not present

## 2022-02-13 DIAGNOSIS — S0511XA Contusion of eyeball and orbital tissues, right eye, initial encounter: Secondary | ICD-10-CM | POA: Diagnosis not present

## 2022-02-13 DIAGNOSIS — H538 Other visual disturbances: Secondary | ICD-10-CM | POA: Diagnosis not present

## 2022-02-13 DIAGNOSIS — R42 Dizziness and giddiness: Secondary | ICD-10-CM | POA: Diagnosis not present

## 2022-02-13 DIAGNOSIS — R112 Nausea with vomiting, unspecified: Secondary | ICD-10-CM | POA: Insufficient documentation

## 2022-02-13 DIAGNOSIS — R519 Headache, unspecified: Secondary | ICD-10-CM | POA: Insufficient documentation

## 2022-02-13 DIAGNOSIS — S0081XA Abrasion of other part of head, initial encounter: Secondary | ICD-10-CM | POA: Diagnosis not present

## 2022-02-13 DIAGNOSIS — W19XXXA Unspecified fall, initial encounter: Secondary | ICD-10-CM

## 2022-02-13 DIAGNOSIS — R110 Nausea: Secondary | ICD-10-CM | POA: Diagnosis not present

## 2022-02-13 DIAGNOSIS — S01111A Laceration without foreign body of right eyelid and periocular area, initial encounter: Secondary | ICD-10-CM | POA: Diagnosis not present

## 2022-02-13 DIAGNOSIS — W04XXXA Fall while being carried or supported by other persons, initial encounter: Secondary | ICD-10-CM | POA: Diagnosis not present

## 2022-02-13 LAB — I-STAT BETA HCG BLOOD, ED (MC, WL, AP ONLY): I-stat hCG, quantitative: <5 m[IU]/mL (ref <5)

## 2022-02-13 LAB — COMPREHENSIVE METABOLIC PANEL
ALT: 17 U/L (ref 0–44)
AST: 19 U/L (ref 15–41)
Albumin: 4.7 g/dL (ref 3.5–5.0)
Alkaline Phosphatase: 57 U/L (ref 38–126)
Anion gap: 8 (ref 5–15)
BUN: 13 mg/dL (ref 6–20)
CO2: 23 mmol/L (ref 22–32)
Calcium: 8.6 mg/dL — ABNORMAL LOW (ref 8.9–10.3)
Chloride: 111 mmol/L (ref 98–111)
Creatinine, Ser: 0.62 mg/dL (ref 0.44–1.00)
GFR, Estimated: >60 mL/min (ref >60)
Glucose, Bld: 128 mg/dL — ABNORMAL HIGH (ref 70–99)
Potassium: 3.4 mmol/L — ABNORMAL LOW (ref 3.5–5.1)
Sodium: 142 mmol/L (ref 135–145)
Total Bilirubin: 0.3 mg/dL (ref 0.3–1.2)
Total Protein: 8.1 g/dL (ref 6.5–8.1)

## 2022-02-13 LAB — CBC
HCT: 38.3 % (ref 36.0–46.0)
Hemoglobin: 13.2 g/dL (ref 12.0–15.0)
MCH: 32.4 pg (ref 26.0–34.0)
MCHC: 34.5 g/dL (ref 30.0–36.0)
MCV: 94.1 fL (ref 80.0–100.0)
Platelets: 358 10*3/uL (ref 150–400)
RBC: 4.07 MIL/uL (ref 3.87–5.11)
RDW: 11.9 % (ref 11.5–15.5)
WBC: 9.1 10*3/uL (ref 4.0–10.5)
nRBC: 0 % (ref 0.0–0.2)

## 2022-02-13 LAB — LIPASE, BLOOD: Lipase: 38 U/L (ref 11–51)

## 2022-02-13 LAB — ETHANOL: Alcohol, Ethyl (B): 187 mg/dL — ABNORMAL HIGH (ref <10)

## 2022-02-13 MED ORDER — CEPHALEXIN 500 MG PO CAPS: 500.0000 mg | ORAL_CAPSULE | Freq: Three times a day (TID) | ORAL | 0 refills | Status: AC

## 2022-02-13 MED ORDER — SODIUM CHLORIDE 0.9 % IV BOLUS
1000.0000 mL | Freq: Once | INTRAVENOUS | Status: AC
  Administered 2022-02-13: 1000 mL via INTRAVENOUS

## 2022-02-13 MED ORDER — LIDOCAINE HCL 1 % IJ SOLN
INTRAMUSCULAR | Status: AC
  Filled 2022-02-13: qty 20

## 2022-02-13 MED ORDER — ONDANSETRON HCL 4 MG/2ML IJ SOLN
4.0000 mg | Freq: Once | INTRAMUSCULAR | Status: AC
  Administered 2022-02-13: 4 mg via INTRAVENOUS
  Filled 2022-02-13: qty 2
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
## 2022-02-13 NOTE — ED Notes (Signed)
ENT provider at bedside to repair wound to right eye.

## 2022-02-13 NOTE — ED Notes (Signed)
Resting quietly. Moist gauze remains over right eye. No active bleeding. No signs of distress.  
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
## 2022-02-13 NOTE — ED Notes (Signed)
Resting quietly. Moist gauze remains over right eye. No active bleeding. No signs of distress.
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
## 2022-02-13 NOTE — Discharge Instructions (Addendum)
Your facial laceration has been attended to in the ED today.  Please follow wound care instructions as directed by Dr. Jearld Fenton.  You have also been provided the contact information for Dr. Janne Napoleon, a plastic surgeon.  Please call first thing tomorrow morning to schedule a follow-up appointment 1 week.  You have also been prescribed an antibiotic by the Keflex.  Take 1 capsule every 8 hours for the next week.  Always take with plenty of food and water.  You may take a probiotic at the same time to reduce the risk of antibiotic associated yeast infection or diarrhea.  May also continue use of ibuprofen and Tylenol for pain management.  Return to the ED for any new or worsening symptoms as discussed.

## 2022-02-17 ENCOUNTER — Telehealth: Payer: Self-pay

## 2022-02-17 NOTE — Telephone Encounter (Signed)
Called pt back, na, not able to leave vm as inbox was full.

## 2022-02-17 NOTE — Telephone Encounter (Signed)
Pt called to seek advice about how to dress her facial lacerations. She stated the ER didn't tell her how to change or what to dress them with. Pt has a f/u appt tomorrow with Dr. Domenica Reamer but wanted to know what to do tonight before coming in tomorrow. I adv pt that I would ask the on site PA [facility]) and give her a call back once I received a response. Pt conveyed understanding.

## 2022-02-18 ENCOUNTER — Ambulatory Visit (INDEPENDENT_AMBULATORY_CARE_PROVIDER_SITE_OTHER): Payer: BC Managed Care – PPO | Admitting: Plastic Surgery

## 2022-02-18 DIAGNOSIS — W04XXXA Fall while being carried or supported by other persons, initial encounter: Secondary | ICD-10-CM

## 2022-02-18 DIAGNOSIS — S01411A Laceration without foreign body of right cheek and temporomandibular area, initial encounter: Secondary | ICD-10-CM

## 2022-02-18 DIAGNOSIS — S0993XA Unspecified injury of face, initial encounter: Secondary | ICD-10-CM

## 2022-02-18 DIAGNOSIS — L989 Disorder of the skin and subcutaneous tissue, unspecified: Secondary | ICD-10-CM

## 2022-02-19 NOTE — Progress Notes (Signed)
Referring Provider Renee Rival, [facility] [address],  [place] [address]   CC: No chief complaint on file.    Face laceration  Ashley Greene is an 31 y.o. female.  HPI: 31 year old with facial laceration.  She had myriad placed when she jumped on her boyfriend Waldron Session was already and she fell.  No fever no chills.    Allergies  Allergen Reactions   Amoxicillin Diarrhea    Stomach upset    Valtrex [Valacyclovir]     Stomach upset     Outpatient Encounter Medications as of 02/18/2022  Medication Sig   acyclovir (ZOVIRAX) 400 MG tablet Take 1 tablet (400 mg total) by mouth 2 (two) times daily.   augmented betamethasone dipropionate (DIPROLENE-AF) 0.05 % cream Apply topically 2 (two) times daily. (Patient not taking: Reported on 02/13/2022)   cephALEXin (KEFLEX) 500 MG capsule Take 1 capsule (500 mg total) by mouth 3 (three) times daily for 7 days.   ibuprofen (ADVIL) 800 MG tablet Take 1 tablet (800 mg total) by mouth every 8 (eight) hours as needed. (Patient not taking: Reported on 12/09/2021)   Lidocaine (HM LIDOCAINE PATCH) 4 % PTCH Apply 1 each topically daily as needed. (Patient not taking: Reported on 12/09/2021)   mupirocin ointment (BACTROBAN) 2 % Apply 1 application topically 2 (two) times daily. (Patient not taking: Reported on 12/09/2021)   Prenatal Vit-Fe Fumarate-FA (PRENATAL VITAMINS PO) Take by mouth. (Patient not taking: Reported on 12/09/2021)   triamcinolone cream (KENALOG) 0.1 % Apply 1 application topically 2 (two) times daily. (Patient not taking: Reported on 12/09/2021)   No facility-administered encounter medications on file as of 02/18/2022.     Past Medical History:  Diagnosis Date   Herpes simplex virus (HSV) infection    Medical history non-contributory    Positive GBS test 04/27/2020   Declines antibiotics, counseled   Post term pregnancy over 40 weeks 05/19/2020   Rubella immune 11/11/2019   Supervision of normal first  pregnancy, antepartum 10/15/2019    Nursing Staff Provider Office Location [facility] Dating   LMP Language  English  Anatomy US   normal Flu Vaccine  Declined-11/11/19 Genetic Screen  NIPS: normal female AFP: negtive  First Screen:  Quad:   TDaP vaccine  03/17/20 Hgb A1C or  GTT Early  n/a Third trimester neg Rhogam  02/25/20   LAB RESULTS    Blood Type   ONeg Feeding Plan Breast  Antibody  Neg Contraception None Rubella   Immune Circumc    Past Surgical History:  Procedure Laterality Date   WISDOM TOOTH EXTRACTION      Family History  Problem Relation Age of Onset   Healthy Mother    Healthy Father    Liver cancer Paternal Grandfather    Heart attack Paternal Grandmother    Stroke Paternal Grandmother    Dementia Paternal Grandmother    Bone cancer Maternal Grandfather    Healthy Half-Brother    Healthy Half-Sister     Social History   Social History Narrative   Not on file     Review of Systems General: Denies fevers, chills, weight loss CV: Denies chest pain, shortness of breath, palpitations   Physical Exam    02/13/2022   10:00 AM 02/13/2022    9:00 AM 02/13/2022    8:00 AM  Vitals with BMI  Systolic [phone]  Diastolic 66 62 72  Pulse 92 113 112    General:  No acute distress,  Alert and oriented, Non-Toxic, Normal speech and affect HEENT: Skin graft substitute appears intact  Assessment/Plan Patient is doing well after skin graft substitute right now the plan is to allow her to continue to granulate.    Lennice Sites 02/19/2022, 12:37 PM

## 2022-02-23 ENCOUNTER — Institutional Professional Consult (permissible substitution): Payer: BC Managed Care – PPO | Admitting: Plastic Surgery

## 2022-02-24 ENCOUNTER — Ambulatory Visit (INDEPENDENT_AMBULATORY_CARE_PROVIDER_SITE_OTHER): Payer: BC Managed Care – PPO | Admitting: Plastic Surgery

## 2022-02-24 DIAGNOSIS — S0993XA Unspecified injury of face, initial encounter: Secondary | ICD-10-CM

## 2022-02-24 DIAGNOSIS — S01411A Laceration without foreign body of right cheek and temporomandibular area, initial encounter: Secondary | ICD-10-CM

## 2022-02-25 NOTE — Progress Notes (Signed)
   Referring Provider Renee Rival, [facility] [address],  [place] [address]   CC:  Right face laceration  Ashley Greene is an 31 y.o. female.  HPI: 31 year old with right face laceration.  She had myriad placed at the time of the injury which was on 02/13/2022.  She is doing well with no fever chills or other complaints.  Review of Systems General: No fever or chills.  Physical Exam    02/13/2022   10:00 AM 02/13/2022    9:00 AM 02/13/2022    8:00 AM  Vitals with BMI  Systolic [phone]  Diastolic 66 62 72  Pulse 92 113 112    General:  No acute distress,  Alert and oriented, Non-Toxic, Normal speech and affect HEENT: Well-healing facial wound.  Granulation tissue forming.  Assessment/Plan Since spent almost 2 weeks the mesh dressing was removed.  Patient will keep ophthalmic ointment on the face wound.  We discussed grafting again with skin graft substitute but the wound is filling in nicely and I think it is reasonable to treat her conservatively at this time.  The patient agrees.  Lennice Sites 02/25/2022, 4:10 AM

## 2022-02-28 ENCOUNTER — Telehealth: Payer: Self-pay | Admitting: *Deleted

## 2022-02-28 NOTE — Telephone Encounter (Signed)
Faxed order,demographics,recent insurance card, and recent office note to [facility] for supplies for the patient.  Confirmation received and copy scanned into the chart.//AB/CMA   Received on (02/28/22) via of ELY:HTMBP Status Notification from [facility];[employer] will be contacting the patient with pricing options.  Their deductible has not yet been satisfied.  Thank you for your patient!  Copy  scanned into the chart.//AB/CMA

## 2022-03-01 ENCOUNTER — Ambulatory Visit: Payer: BC Managed Care – PPO | Admitting: Plastic Surgery

## 2022-03-01 ENCOUNTER — Telehealth: Payer: Self-pay

## 2022-03-01 NOTE — Telephone Encounter (Signed)
Patient was seen on 9/7 - at that time she was able to remove the dressings and let the wound heal. She stated she was told to call if the healing starting pulling the lower lid and it is. She wanted to call and let someone know.

## 2022-03-01 NOTE — Telephone Encounter (Signed)
If that does not work for her we can look for another time soon.  Thanks

## 2022-03-01 NOTE — Telephone Encounter (Signed)
Patient is scheduled for Friday

## 2022-03-04 ENCOUNTER — Ambulatory Visit (INDEPENDENT_AMBULATORY_CARE_PROVIDER_SITE_OTHER): Payer: BC Managed Care – PPO | Admitting: Plastic Surgery

## 2022-03-04 DIAGNOSIS — S01411A Laceration without foreign body of right cheek and temporomandibular area, initial encounter: Secondary | ICD-10-CM | POA: Diagnosis not present

## 2022-03-04 DIAGNOSIS — S0993XA Unspecified injury of face, initial encounter: Secondary | ICD-10-CM

## 2022-03-05 NOTE — Progress Notes (Signed)
Status post right facial injury.  Patient noted some mild eyelid retraction.  Physical exam Right facial wound healing well but does have mild right eyelid ectropion  Assessment and plan Discussed options for repair but patient also does not want additional facial scar so we will talk about this more.

## 2022-03-11 ENCOUNTER — Ambulatory Visit: Payer: BC Managed Care – PPO | Admitting: Plastic Surgery

## 2022-03-15 ENCOUNTER — Telehealth: Payer: Self-pay

## 2022-03-15 NOTE — Telephone Encounter (Signed)
Tried to call patient back- NA/NVM

## 2022-03-16 NOTE — Telephone Encounter (Signed)
Pt called to inquire if Dr. Erin Hearing had a chance to consult with a colleague about lower eye lid surgery. She stated that since the last time she saw Dr. Erin Hearing the drooping has doubled. I adv that Dr. Erin Hearing had cases today but I would send a message to him and would get back to her when I received a response. She conveyed understanding.

## 2022-03-23 NOTE — Telephone Encounter (Signed)
I have now spoken with her and Ashley Greene and she knows to expect a call to be scheduled with him.  She is good with the plan.

## 2022-03-28 DIAGNOSIS — H02112 Cicatricial ectropion of right lower eyelid: Secondary | ICD-10-CM | POA: Diagnosis not present

## 2022-03-28 DIAGNOSIS — H16211 Exposure keratoconjunctivitis, right eye: Secondary | ICD-10-CM | POA: Diagnosis not present

## 2022-03-28 DIAGNOSIS — L905 Scar conditions and fibrosis of skin: Secondary | ICD-10-CM | POA: Diagnosis not present

## 2022-03-28 DIAGNOSIS — H02532 Eyelid retraction right lower eyelid: Secondary | ICD-10-CM | POA: Diagnosis not present

## 2022-03-28 DIAGNOSIS — S01111S Laceration without foreign body of right eyelid and periocular area, sequela: Secondary | ICD-10-CM | POA: Diagnosis not present

## 2022-05-05 ENCOUNTER — Telehealth: Payer: BC Managed Care – PPO | Admitting: Family Medicine

## 2022-05-05 DIAGNOSIS — H00011 Hordeolum externum right upper eyelid: Secondary | ICD-10-CM

## 2022-05-05 MED ORDER — POLYMYXIN B-TRIMETHOPRIM 10000-0.1 UNIT/ML-% OP SOLN: 1.0000 [drp] | OPHTHALMIC | 0 refills | Status: DC

## 2022-05-05 NOTE — Progress Notes (Signed)
  E-Visit for Stye   We are sorry that you are not feeling well. Here is how we plan to help!  Based on what you have shared with me it looks like you have a stye.  A stye is an inflammation of the eyelid.  It is often a red, painful lump near the edge of the eyelid that may look like a boil or a pimple.  A stye develops when an infection occurs at the base of an eyelash.   We have made appropriate suggestions for you based upon your presentation: Simple styes can be treated without medical intervention.  Most styes either resolve spontaneously or resolve with simple home treatment by applying warm compresses or heated washcloth to the stye for about 10-15 minutes three to four times a day. This causes the stye to drain and resolve.   Due to the discharge you are having from eye- I have ordered drops to help with possible infection.  HOME CARE:  Wash your hands often! Let the stye open on its own. Don't squeeze or open it. Don't rub your eyes. This can irritate your eyes and let in bacteria.  If you need to touch your eyes, wash your hands first. Don't wear eye makeup or contact lenses until the area has healed.  GET HELP RIGHT AWAY IF:  Your symptoms do not improve. You develop blurred or loss of vision. Your symptoms worsen (increased discharge, pain or redness).   Thank you for choosing an e-visit.  Your e-visit answers were reviewed by a board certified advanced clinical practitioner to complete your personal care plan. Depending upon the condition, your plan could have included both over the counter or prescription medications.  Please review your pharmacy choice. Make sure the pharmacy is open so you can pick up prescription now. If there is a problem, you may contact your provider through [employer] and have the prescription routed to another pharmacy.  Your safety is important to [place]. If you have drug allergies check your prescription carefully.   For the next 24 hours  you can use MyChart to ask questions about today's visit, request a non-urgent call back, or ask for a work or school excuse. You will get an email in the next two days asking about your experience. I hope that your e-visit has been valuable and will speed your recovery.  I provided 5 minutes of non face-to-face time during this encounter for chart review, medication and order placement, as well as and documentation.

## 2022-05-16 DIAGNOSIS — H02112 Cicatricial ectropion of right lower eyelid: Secondary | ICD-10-CM | POA: Diagnosis not present

## 2022-05-16 DIAGNOSIS — L905 Scar conditions and fibrosis of skin: Secondary | ICD-10-CM | POA: Diagnosis not present

## 2022-05-16 DIAGNOSIS — H02532 Eyelid retraction right lower eyelid: Secondary | ICD-10-CM | POA: Diagnosis not present

## 2022-05-16 DIAGNOSIS — S01111S Laceration without foreign body of right eyelid and periocular area, sequela: Secondary | ICD-10-CM | POA: Diagnosis not present

## 2022-06-09 ENCOUNTER — Telehealth: Payer: BC Managed Care – PPO | Admitting: Physician Assistant

## 2022-06-09 DIAGNOSIS — H00011 Hordeolum externum right upper eyelid: Secondary | ICD-10-CM

## 2022-06-09 MED ORDER — ERYTHROMYCIN 5 MG/GM OP OINT: 1.0000 | TOPICAL_OINTMENT | Freq: Two times a day (BID) | OPHTHALMIC | 0 refills | Status: DC

## 2022-06-09 NOTE — Progress Notes (Signed)
  E-Visit for Stye   We are sorry that you are not feeling well. Here is how we plan to help!  Based on what you have shared with me it looks like you have a stye.  A stye is an inflammation of the eyelid.  It is often a red, painful lump near the edge of the eyelid that may look like a boil or a pimple.  A stye develops when an infection occurs at the base of an eyelash.   We have made appropriate suggestions for you based upon your presentation: I have prescribed Erythromycin ointment Apply about an inch of medication to affected lid twice daily.  HOME CARE:  Wash your hands often! Let the stye open on its own. Don't squeeze or open it. Don't rub your eyes. This can irritate your eyes and let in bacteria.  If you need to touch your eyes, wash your hands first. Don't wear eye makeup or contact lenses until the area has healed.  GET HELP RIGHT AWAY IF:  Your symptoms do not improve. You develop blurred or loss of vision. Your symptoms worsen (increased discharge, pain or redness).   Thank you for choosing an e-visit.  Your e-visit answers were reviewed by a board certified advanced clinical practitioner to complete your personal care plan. Depending upon the condition, your plan could have included both over the counter or prescription medications.  Please review your pharmacy choice. Make sure the pharmacy is open so you can pick up prescription now. If there is a problem, you may contact your provider through [employer] and have the prescription routed to another pharmacy.  Your safety is important to [place]. If you have drug allergies check your prescription carefully.   For the next 24 hours you can use MyChart to ask questions about today's visit, request a non-urgent call back, or ask for a work or school excuse. You will get an email in the next two days asking about your experience. I hope that your e-visit has been valuable and will speed your recovery.   I have spent 5  minutes in review of e-visit questionnaire, review and updating patient chart, medical decision making and response to patient.   Margaretann Loveless, PA-C

## 2022-06-20 ENCOUNTER — Telehealth: Payer: BC Managed Care – PPO | Admitting: Nurse Practitioner

## 2022-06-20 DIAGNOSIS — J40 Bronchitis, not specified as acute or chronic: Secondary | ICD-10-CM | POA: Diagnosis not present

## 2022-06-20 DIAGNOSIS — R051 Acute cough: Secondary | ICD-10-CM

## 2022-06-20 MED ORDER — ALBUTEROL SULFATE HFA 108 (90 BASE) MCG/ACT IN AERS: 2.0000 | INHALATION_SPRAY | Freq: Four times a day (QID) | RESPIRATORY_TRACT | 0 refills | Status: DC | PRN

## 2022-06-20 MED ORDER — PREDNISONE 10 MG (21) PO TBPK: ORAL_TABLET | ORAL | 0 refills | Status: DC

## 2022-06-20 MED ORDER — BENZONATATE 100 MG PO CAPS: 100.0000 mg | ORAL_CAPSULE | Freq: Three times a day (TID) | ORAL | 0 refills | Status: DC | PRN

## 2022-06-20 NOTE — Progress Notes (Signed)
We are sorry that you are not feeling well.  Here is how we plan to help!  Based on your presentation I believe you most likely have A cough due to a virus.  This is called viral bronchitis and is best treated by rest, plenty of fluids and control of the cough.  You may use Ibuprofen or Tylenol as directed to help your symptoms.     In addition you may use A prescription cough medication called Tessalon Perles 100mg . You may take 1-2 capsules every 8 hours as needed for your cough.  Prednisone 10 mg daily for 6 days (see taper instructions below)  Meds ordered this encounter  Medications   albuterol (VENTOLIN HFA) 108 (90 Base) MCG/ACT inhaler    Sig: Inhale 2 puffs into the lungs every 6 (six) hours as needed for wheezing or shortness of breath.    Dispense:  8 g    Refill:  0   benzonatate (TESSALON) 100 MG capsule    Sig: Take 1 capsule (100 mg total) by mouth 3 (three) times daily as needed.    Dispense:  30 capsule    Refill:  0   predniSONE (STERAPRED UNI-PAK 21 TAB) 10 MG (21) TBPK tablet    Sig: Take 6 tablets on day one, 5 on day two, 4 on day three, 3 on day four, 2 on day five, and 1 on day six. Take with food.    Dispense:  21 tablet    Refill:  0     We will also send a prescription for an Albuterol inhaler to help with your breathing. If you feel that the inhaler and the steroids do not help, or if you continue to feel short of breath or have a worsening fever you will need to be evaluated in person at an urgent care or ER  From your responses in the eVisit questionnaire you describe inflammation in the upper respiratory tract which is causing a significant cough.  This is commonly called Bronchitis and has four common causes:   Allergies Viral Infections Acid Reflux Bacterial Infection Allergies, viruses and acid reflux are treated by controlling symptoms or eliminating the cause. An example might be a cough caused by taking certain blood pressure medications. You stop  the cough by changing the medication. Another example might be a cough caused by acid reflux. Controlling the reflux helps control the cough.  USE OF BRONCHODILATOR ("RESCUE") INHALERS: There is a risk from using your bronchodilator too frequently.  The risk is that over-reliance on a medication which only relaxes the muscles surrounding the breathing tubes can reduce the effectiveness of medications prescribed to reduce swelling and congestion of the tubes themselves.  Although you feel brief relief from the bronchodilator inhaler, your asthma may actually be worsening with the tubes becoming more swollen and filled with mucus.  This can delay other crucial treatments, such as oral steroid medications. If you need to use a bronchodilator inhaler daily, several times per day, you should discuss this with your provider.  There are probably better treatments that could be used to keep your asthma under control.     HOME CARE Only take medications as instructed by your medical team. Complete the entire course of an antibiotic. Drink plenty of fluids and get plenty of rest. Avoid close contacts especially the very young and the elderly Cover your mouth if you cough or cough into your sleeve. Always remember to wash your hands A steam or ultrasonic humidifier can  help congestion.   GET HELP RIGHT AWAY IF: You develop worsening fever. You become short of breath You cough up blood. Your symptoms persist after you have completed your treatment plan MAKE SURE YOU  Understand these instructions. Will watch your condition. Will get help right away if you are not doing well or get worse.    Thank you for choosing an e-visit.  Your e-visit answers were reviewed by a board certified advanced clinical practitioner to complete your personal care plan. Depending upon the condition, your plan could have included both over the counter or prescription medications.  Please review your pharmacy choice. Make  sure the pharmacy is open so you can pick up prescription now. If there is a problem, you may contact your provider through [employer] and have the prescription routed to another pharmacy.  Your safety is important to [place]. If you have drug allergies check your prescription carefully.   For the next 24 hours you can use MyChart to ask questions about today's visit, request a non-urgent call back, or ask for a work or school excuse. You will get an email in the next two days asking about your experience. I hope that your e-visit has been valuable and will speed your recovery.   I spent approximately 5 minutes reviewing the patient's history, current symptoms and coordinating their care today.

## 2022-06-23 ENCOUNTER — Telehealth: Payer: BC Managed Care – PPO | Admitting: Family Medicine

## 2022-06-23 DIAGNOSIS — B9689 Other specified bacterial agents as the cause of diseases classified elsewhere: Secondary | ICD-10-CM | POA: Diagnosis not present

## 2022-06-23 DIAGNOSIS — J208 Acute bronchitis due to other specified organisms: Secondary | ICD-10-CM | POA: Diagnosis not present

## 2022-06-23 MED ORDER — DOXYCYCLINE HYCLATE 100 MG PO TABS: 100.0000 mg | ORAL_TABLET | Freq: Two times a day (BID) | ORAL | 0 refills | Status: AC

## 2022-06-23 MED ORDER — PREDNISONE 10 MG (21) PO TBPK: ORAL_TABLET | ORAL | 0 refills | Status: DC

## 2022-06-23 MED ORDER — PSEUDOEPH-BROMPHEN-DM 30-2-10 MG/5ML PO SYRP: 5.0000 mL | ORAL_SOLUTION | Freq: Four times a day (QID) | ORAL | 0 refills | Status: DC | PRN

## 2022-06-23 NOTE — Patient Instructions (Addendum)
Ashley Greene, thank you for joining [name], NP for today's virtual visit.  While this provider is not your primary care provider (PCP), if your PCP is located in our provider database this encounter information will be shared with them immediately following your visit.   [facility] account gives you access to today's visit and all your visits, tests, and labs performed at [facility] " click here if you don't have a [facility] account or go to [email or web address]  Consent: (Patient) Ashley Greene provided verbal consent for this virtual visit at the beginning of the encounter.  Current Medications:  Current Outpatient Medications:    brompheniramine-pseudoephedrine-DM 30-2-10 MG/5ML syrup, Take 5 mLs by mouth 4 (four) times daily as needed., Disp: 120 mL, Rfl: 0   doxycycline (VIBRA-TABS) 100 MG tablet, Take 1 tablet (100 mg total) by mouth 2 (two) times daily for 10 days., Disp: 20 tablet, Rfl: 0   predniSONE (STERAPRED UNI-PAK 21 TAB) 10 MG (21) TBPK tablet, Tablet as directed, Disp: 21 tablet, Rfl: 0   acyclovir (ZOVIRAX) 400 MG tablet, Take 1 tablet (400 mg total) by mouth 2 (two) times daily., Disp: 60 tablet, Rfl: 6   albuterol (VENTOLIN HFA) 108 (90 Base) MCG/ACT inhaler, Inhale 2 puffs into the lungs every 6 (six) hours as needed for wheezing or shortness of breath., Disp: 8 g, Rfl: 0   augmented betamethasone dipropionate (DIPROLENE-AF) 0.05 % cream, Apply topically 2 (two) times daily. (Patient not taking: Reported on 02/13/2022), Disp: 30 g, Rfl: 1   benzonatate (TESSALON) 100 MG capsule, Take 1 capsule (100 mg total) by mouth 3 (three) times daily as needed., Disp: 30 capsule, Rfl: 0   erythromycin ophthalmic ointment, Place 1 Application into the right eye in the morning and at bedtime., Disp: 10 g, Rfl: 0   ibuprofen (ADVIL) 800 MG tablet, Take 1 tablet (800 mg total) by mouth every 8 (eight) hours as needed. (Patient not taking:  Reported on 12/09/2021), Disp: 30 tablet, Rfl: 0   Lidocaine (HM LIDOCAINE PATCH) 4 % PTCH, Apply 1 each topically daily as needed. (Patient not taking: Reported on 12/09/2021), Disp: 15 patch, Rfl: 1   mupirocin ointment (BACTROBAN) 2 %, Apply 1 application topically 2 (two) times daily. (Patient not taking: Reported on 12/09/2021), Disp: 22 g, Rfl: 0   Prenatal Vit-Fe Fumarate-FA (PRENATAL VITAMINS PO), Take by mouth. (Patient not taking: Reported on 12/09/2021), Disp: , Rfl:    triamcinolone cream (KENALOG) 0.1 %, Apply 1 application topically 2 (two) times daily. (Patient not taking: Reported on 12/09/2021), Disp: 30 g, Rfl: 0   trimethoprim-polymyxin b (POLYTRIM) ophthalmic solution, Place 1 drop into the right eye every 4 (four) hours. 5 days, Disp: 10 mL, Rfl: 0   Medications ordered in this encounter:  Meds ordered this encounter  Medications   predniSONE (STERAPRED UNI-PAK 21 TAB) 10 MG (21) TBPK tablet    Sig: Tablet as directed    Dispense:  21 tablet    Refill:  0    Order Specific Question:   Supervising Provider    Answer:   Chase Picket [[id number]]   doxycycline (VIBRA-TABS) 100 MG tablet    Sig: Take 1 tablet (100 mg total) by mouth 2 (two) times daily for 10 days.    Dispense:  20 tablet    Refill:  0    Order Specific Question:   Supervising Provider    Answer:   Chase Picket [id number]   brompheniramine-pseudoephedrine-DM 30-2-10  MG/5ML syrup    Sig: Take 5 mLs by mouth 4 (four) times daily as needed.    Dispense:  120 mL    Refill:  0    Order Specific Question:   Supervising Provider    Answer:   Chase Picket [[id number]]     *If you need refills on other medications prior to your next appointment, please contact your pharmacy*  Follow-Up: Call back or seek an in-person evaluation if the symptoms worsen or if the condition fails to improve as anticipated.  [facility] [phone]  Other Instructions  - Take meds as prescribed - Rest  voice - Use a cool mist humidifier especially during the Valade months when heat dries out the air. - Use saline nose sprays frequently to help soothe nasal passages if they are drying out. - Stay hydrated by drinking plenty of fluids - Keep thermostat turn down low to prevent drying out which can cause a dry cough. - For any cough or congestion- robitussin DM or Delsym as needed - For fever or aches or pains- take tylenol or ibuprofen as directed on bottle             * for fevers greater than 101 orally you may alternate ibuprofen and tylenol every 3 hours.  If you do not improve you will need a follow up visit in person.                 If you have been instructed to have an in-person evaluation today at a local Urgent Care facility, please use the link below. It will take you to a list of all of our available [facility], including address, phone number and hours of operation. Please do not delay care.  [facility]  If you or a family member do not have a primary care provider, use the link below to schedule a visit and establish care. When you choose a [facility] primary care physician or advanced practice provider, you gain a long-term partner in health. Find a Primary Care Provider  Learn more about [facility]'s in-office and virtual care options: [facility]

## 2022-06-23 NOTE — Progress Notes (Signed)
Virtual Visit Consent   Ashley Greene, you are scheduled for a virtual visit with a [facility] provider today. Just as with appointments in the office, your consent must be obtained to participate. Your consent will be active for this visit and any virtual visit you may have with one of our providers in the next 365 days. If you have a MyChart account, a copy of this consent can be sent to you electronically.  As this is a virtual visit, video technology does not allow for your provider to perform a traditional examination. This may limit your provider's ability to fully assess your condition. If your provider identifies any concerns that need to be evaluated in person or the need to arrange testing (such as labs, EKG, etc.), we will make arrangements to do so. Although advances in technology are sophisticated, we cannot ensure that it will always work on either your end or our end. If the connection with a video visit is poor, the visit may have to be switched to a telephone visit. With either a video or telephone visit, we are not always able to ensure that we have a secure connection.  By engaging in this virtual visit, you consent to the provision of healthcare and authorize for your insurance to be billed (if applicable) for the services provided during this visit. Depending on your insurance coverage, you may receive a charge related to this service.  I need to obtain your verbal consent now. Are you willing to proceed with your visit today? Ashley Greene has provided verbal consent on 06/23/2022 for a virtual visit (video or telephone). Ashley Mayo, NP  Date: 06/23/2022 10:32 AM  Virtual Visit via Video Note   I, Ashley Greene, connected with  Ashley Greene  ([id number], 14-Apr-1991) on 06/23/22 at 10:45 AM EST by a video-enabled telemedicine application and verified that I am speaking with the correct person using two identifiers.  Location: Patient: Virtual Visit Location Patient:  Home Provider: Virtual Visit Location Provider: Home Office   I discussed the limitations of evaluation and management by telemedicine and the availability of in person appointments. The patient expressed understanding and agreed to proceed.    History of Present Illness: Ashley Greene is a 32 y.o. who identifies as a female who was assigned female at birth, and is being seen today for on going cough and mucus increasing with on an off fevers with t max- 101. Runny nose, and congestion, and some sore throat. Increased shortness of breath. Was seen on 1/1- but unable to get pred dose pack from phar- they were out. Inhaler has helped some, perles not so much.  Denies chest pain.   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 (Patient not taking: Reported on 02/13/2022), Disp: 30 g, Rfl: 1   benzonatate (TESSALON) 100 MG capsule, Take 1 capsule (100 mg total) by mouth 3 (three) times daily as needed., Disp: 30 capsule, Rfl: 0   erythromycin ophthalmic ointment, Place 1 Application into the right eye in the morning and at bedtime., Disp: 10 g, Rfl: 0   ibuprofen  (ADVIL) 800 MG tablet, Take 1 tablet (800 mg total) by mouth every 8 (eight) hours as needed. (Patient not taking: Reported on 12/09/2021), Disp: 30 tablet, Rfl: 0   Lidocaine (HM LIDOCAINE PATCH) 4 % PTCH, Apply 1 each topically daily as needed. (Patient not taking: Reported on 12/09/2021), Disp: 15 patch, Rfl: 1   mupirocin ointment (BACTROBAN) 2 %, Apply 1 application topically 2 (two) times daily. (Patient not taking: Reported on 12/09/2021), Disp: 22 g, Rfl: 0   predniSONE (STERAPRED UNI-PAK 21 TAB) 10 MG (21) TBPK tablet, Take 6 tablets on day one, 5 on day two, 4 on day three, 3 on day four, 2 on day five, and 1 on day six. Take with food., Disp: 21 tablet, Rfl: 0   Prenatal Vit-Fe Fumarate-FA (PRENATAL VITAMINS PO), Take by mouth. (Patient not taking: Reported on 12/09/2021), Disp: , Rfl:    triamcinolone cream (KENALOG) 0.1 %, Apply 1 application topically 2 (two) times daily. (Patient not taking: Reported on 12/09/2021), Disp: 30 g, Rfl: 0   trimethoprim-polymyxin b (POLYTRIM) ophthalmic solution, Place 1 drop into the right eye every 4 (four) hours. 5 days, Disp: 10 mL, Rfl: 0  Observations/Objective: Patient is well-developed, well-nourished in no acute distress.  Resting comfortably  at home.  Head is normocephalic, atraumatic.  No labored breathing.  Speech is clear and coherent with logical content.  Patient is alert and oriented at baseline.  Cough  Nasal tone  Assessment and Plan:  1. Acute bacterial bronchitis  - predniSONE (STERAPRED UNI-PAK 21 TAB) 10 MG (21) TBPK tablet; Tablet as directed  Dispense: 21 tablet; Refill: 0 - doxycycline (VIBRA-TABS) 100 MG tablet; Take 1 tablet (100 mg total) by mouth 2 (two) times daily for 10 days.  Dispense: 20 tablet; Refill: 0  - Take meds as prescribed - Rest voice - Use a cool mist humidifier especially during the [place] months when heat dries out the air. - Use saline nose sprays frequently to help soothe nasal passages if they are  drying out. - Stay hydrated by drinking plenty of fluids - Keep thermostat turn down low to prevent drying out which can cause a dry cough. - For any cough or congestion- robitussin DM or Delsym as needed - For fever or aches or pains- take tylenol or ibuprofen as directed on bottle             * for fevers greater than 101 orally you may alternate ibuprofen and tylenol every 3 hours.  If you do not improve you will need a follow up visit in person.  -given fevers and increased mucus will start doxy to for coverage- allergy to Augmentin  -Take meds as prescribed -Rest -Use a cool mist humidifier especially during the [place] months when heat dries out the air. - Use saline nose sprays frequently to help soothe nasal passages and promote drainage. -Saline irrigations of the nose can be very helpful if done frequently.             *  4X daily for 1 week*             * Use of a nettie pot can be helpful with this.  *Follow directions with this* *Boiled or distilled water only -stay hydrated by drinking plenty of fluids - Keep thermostat turn down low to prevent drying out sinuses - For any cough or congestion- robitussin DM or Delsym as needed - For fever or aches or pains- take tylenol or ibuprofen as directed on bottle             * for fevers greater than 101 orally you may alternate ibuprofen and tylenol every 3 hours.  If you do not improve you will need a follow up visit in person.               Reviewed side effects, risks and benefits of medication.     Patient acknowledged agreement and understanding of the plan.   Past Medical, Surgical, Social History, Allergies, and Medications have been Reviewed.             Follow Up Instructions: I discussed the assessment and treatment plan with the patient. The patient was provided an opportunity to ask questions and all were answered. The patient agreed with the plan and demonstrated an understanding of the instructions.  A copy of  instructions were sent to the patient via MyChart unless otherwise noted below.     The patient was advised to call back or seek an in-person evaluation if the symptoms worsen or if the condition fails to improve as anticipated.  Time:  I spent 10 minutes with the patient via telehealth technology discussing the above problems/concerns.    Ashley Mayo, NP

## 2022-06-29 NOTE — Progress Notes (Signed)
Ashley Greene,  Looking back you have been sick without improvement for 2 weeks now. Because of your worsening symptoms and possible reaction to medications I would like you to be seen in person.   At this point we need to have someone assess you, check your vital signs, listen to your lungs and possibly order imaging if appropriate.   You can start by calling your primary care, but if unable to be seen there I recommend one of the following urgent care facilities:     [facility] [place] [facility] [place] Get Driving Directions [phone] [address] [place], [place] [address]    [facility]) Get Driving Directions [phone] [place], [place] [address]  [facility] [place] ([place]) Get Driving Directions [phone] [address] [place],  [place]  [address]  [facility] - at [facility] Get Driving Directions  [phone] [address] [place],  [place] [address]   [facility] Get Driving Directions [phone] [address] [place], [place], [place] [address]   [facility] Get Driving Directions  [phone] [address] [place], [place] [address]   [facility] [place] Get Driving Directions [phone] [address], [place],  [address]  Your MyChart E-visit questionnaire answers were reviewed by a board certified advanced clinical practitioner to complete your personal care plan based on your specific symptoms.  Thank you for using e-Visits.

## 2022-07-11 DIAGNOSIS — L905 Scar conditions and fibrosis of skin: Secondary | ICD-10-CM | POA: Diagnosis not present

## 2022-07-11 DIAGNOSIS — H02112 Cicatricial ectropion of right lower eyelid: Secondary | ICD-10-CM | POA: Diagnosis not present

## 2022-07-11 DIAGNOSIS — H0279 Other degenerative disorders of eyelid and periocular area: Secondary | ICD-10-CM | POA: Diagnosis not present

## 2022-08-15 DIAGNOSIS — H019 Unspecified inflammation of eyelid: Secondary | ICD-10-CM | POA: Diagnosis not present

## 2022-08-15 DIAGNOSIS — L905 Scar conditions and fibrosis of skin: Secondary | ICD-10-CM | POA: Diagnosis not present

## 2022-09-12 DIAGNOSIS — L905 Scar conditions and fibrosis of skin: Secondary | ICD-10-CM | POA: Diagnosis not present

## 2022-09-12 DIAGNOSIS — H02112 Cicatricial ectropion of right lower eyelid: Secondary | ICD-10-CM | POA: Diagnosis not present

## 2022-09-12 DIAGNOSIS — H0279 Other degenerative disorders of eyelid and periocular area: Secondary | ICD-10-CM | POA: Diagnosis not present

## 2022-10-21 ENCOUNTER — Telehealth: Payer: BC Managed Care – PPO | Admitting: Nurse Practitioner

## 2022-10-21 DIAGNOSIS — J00 Acute nasopharyngitis [common cold]: Secondary | ICD-10-CM | POA: Diagnosis not present

## 2022-10-22 MED ORDER — FLUTICASONE PROPIONATE 50 MCG/ACT NA SUSP: 2.0000 | Freq: Every day | NASAL | 6 refills | Status: AC

## 2022-10-22 MED ORDER — BENZONATATE 100 MG PO CAPS: 100.0000 mg | ORAL_CAPSULE | Freq: Three times a day (TID) | ORAL | 0 refills | Status: DC | PRN

## 2022-10-22 NOTE — Progress Notes (Signed)

## 2022-10-24 ENCOUNTER — Ambulatory Visit
Admission: RE | Admit: 2022-10-24 | Discharge: 2022-10-24 | Disposition: A | Discharge status: Home / Self Care | Payer: BC Managed Care – PPO | Source: Ambulatory Visit | Attending: Urgent Care | Admitting: Urgent Care

## 2022-10-24 VITALS — BP 109/75 | HR 89 | Temp 98.9°F | Resp 16

## 2022-10-24 DIAGNOSIS — H6691 Otitis media, unspecified, right ear: Secondary | ICD-10-CM

## 2022-10-24 DIAGNOSIS — B9789 Other viral agents as the cause of diseases classified elsewhere: Secondary | ICD-10-CM | POA: Diagnosis not present

## 2022-10-24 DIAGNOSIS — H7291 Unspecified perforation of tympanic membrane, right ear: Secondary | ICD-10-CM

## 2022-10-24 DIAGNOSIS — J988 Other specified respiratory disorders: Secondary | ICD-10-CM | POA: Diagnosis not present

## 2022-10-24 MED ORDER — CEFDINIR 300 MG PO CAPS: 300.0000 mg | ORAL_CAPSULE | Freq: Two times a day (BID) | ORAL | 0 refills | Status: DC

## 2022-10-24 MED ORDER — CETIRIZINE HCL 10 MG PO TABS: 10.0000 mg | ORAL_TABLET | Freq: Every day | ORAL | 0 refills | Status: DC

## 2022-10-24 MED ORDER — PSEUDOEPHEDRINE HCL 60 MG PO TABS: 60.0000 mg | ORAL_TABLET | Freq: Three times a day (TID) | ORAL | 0 refills | Status: DC | PRN

## 2022-10-24 NOTE — ED Provider Notes (Signed)
[facility]RGENT [facility]  Note:  This document was prepared using Conservation officer, historic buildings and may include unintentional dictation errors.  MRN: [id number] DOB: [birth year]  Subjective:   Ashley Greene is a 32 y.o. female presenting for 5-day history of acute onset persistent sinus pressure, sinus drainage, ear pressure.  She has felt a lot of drainage and feels like her ear popped and ruptured on either side.  Has been using Flonase without any relief.  She does have a history of eardrum infections and rupture when she was a little girl.  She has not had to see an ENT specialist for this.  No current facility-administered medications for this encounter.  Current Outpatient Medications:    acyclovir (ZOVIRAX) 400 MG tablet, Take 1 tablet (400 mg total) by mouth 2 (two) times daily., Disp: 60 tablet, Rfl: 6   albuterol (VENTOLIN HFA) 108 (90 Base) MCG/ACT inhaler, Inhale 2 puffs into the lungs every 6 (six) hours as needed for wheezing or shortness of breath., Disp: 8 g, Rfl: 0   augmented betamethasone dipropionate (DIPROLENE-AF) 0.05 % cream, Apply topically 2 (two) times daily. (Patient not taking: Reported on 02/13/2022), Disp: 30 g, Rfl: 1   benzonatate (TESSALON PERLES) 100 MG capsule, Take 1 capsule (100 mg total) by mouth 3 (three) times daily as needed., Disp: 20 capsule, Rfl: 0   brompheniramine-pseudoephedrine-DM 30-2-10 MG/5ML syrup, Take 5 mLs by mouth 4 (four) times daily as needed., Disp: 120 mL, Rfl: 0   erythromycin ophthalmic ointment, Place 1 Application into the right eye in the morning and at bedtime., Disp: 10 g, Rfl: 0   fluticasone (FLONASE) 50 MCG/ACT nasal spray, Place 2 sprays into both nostrils daily., Disp: 16 g, Rfl: 6   ibuprofen (ADVIL) 800 MG tablet, Take 1 tablet (800 mg total) by mouth every 8 (eight) hours as needed. (Patient not taking: Reported on 12/09/2021), Disp: 30 tablet, Rfl: 0   Lidocaine (HM LIDOCAINE PATCH) 4 % PTCH, Apply 1 each  topically daily as needed. (Patient not taking: Reported on 12/09/2021), Disp: 15 patch, Rfl: 1   mupirocin ointment (BACTROBAN) 2 %, Apply 1 application topically 2 (two) times daily. (Patient not taking: Reported on 12/09/2021), Disp: 22 g, Rfl: 0   predniSONE (STERAPRED UNI-PAK 21 TAB) 10 MG (21) TBPK tablet, Tablet as directed, Disp: 21 tablet, Rfl: 0   Prenatal Vit-Fe Fumarate-FA (PRENATAL VITAMINS PO), Take by mouth. (Patient not taking: Reported on 12/09/2021), Disp: , Rfl:    triamcinolone cream (KENALOG) 0.1 %, Apply 1 application topically 2 (two) times daily. (Patient not taking: Reported on 12/09/2021), Disp: 30 g, Rfl: 0   trimethoprim-polymyxin b (POLYTRIM) ophthalmic solution, Place 1 drop into the right eye every 4 (four) hours. 5 days, Disp: 10 mL, Rfl: 0   Allergies  Allergen Reactions   Amoxicillin Diarrhea    Stomach upset    Valtrex [Valacyclovir]     Stomach upset     Past Medical History:  Diagnosis Date   Herpes simplex virus (HSV) infection    Medical history non-contributory    Positive GBS test 04/27/2020   Declines antibiotics, counseled   Post term pregnancy over 40 weeks 05/19/2020   Rubella immune 11/11/2019   Supervision of normal first pregnancy, antepartum 10/15/2019    Nursing Staff Provider Office Location [facility] Dating   LMP Language  English  Anatomy US   normal Flu Vaccine  Declined-11/11/19 Genetic Screen  NIPS: normal female AFP: negtive  First Screen:  Quad:  TDaP vaccine  03/17/20 Hgb A1C or  GTT Early  n/a Third trimester neg Rhogam  02/25/20   LAB RESULTS    Blood Type   ONeg Feeding Plan Breast  Antibody  Neg Contraception None Rubella   Immune Circumc     Past Surgical History:  Procedure Laterality Date   WISDOM TOOTH EXTRACTION      Family History  Problem Relation Age of Onset   Healthy Mother    Healthy Father    Liver cancer Paternal Grandfather    Heart attack Paternal Grandmother    Stroke Paternal Grandmother    Dementia Paternal  Grandmother    Bone cancer Maternal Grandfather    Healthy Half-Brother    Healthy Half-Sister     Social History   Tobacco Use   Smoking status: Never   Smokeless tobacco: Never  Vaping Use   Vaping Use: Never used  Substance Use Topics   Alcohol use: No   Drug use: No    ROS   Objective:   Vitals: BP 109/75 (BP Location: Right Arm)   Pulse 89   Temp 98.9 F (37.2 C) (Oral)   Resp 16   LMP 10/17/2022 (Exact Date)   SpO2 96%   Physical Exam Constitutional:      General: She is not in acute distress.    Appearance: Normal appearance. She is well-developed and normal weight. She is not ill-appearing, toxic-appearing or diaphoretic.  HENT:     Head: Normocephalic and atraumatic.     Right Ear: Ear canal and external ear normal. No drainage or tenderness. A middle ear effusion is present. There is no impacted cerumen. Tympanic membrane is perforated. Tympanic membrane is not erythematous or bulging.     Left Ear: Ear canal and external ear normal. No drainage or tenderness. A middle ear effusion is present. There is no impacted cerumen. Tympanic membrane is not perforated, erythematous or bulging.     Nose: No congestion or rhinorrhea.     Mouth/Throat:     Mouth: Mucous membranes are moist. No oral lesions.     Pharynx: No pharyngeal swelling, oropharyngeal exudate, posterior oropharyngeal erythema or uvula swelling.     Tonsils: No tonsillar exudate or tonsillar abscesses.  Eyes:     General: No scleral icterus.       Right eye: No discharge.        Left eye: No discharge.     Extraocular Movements: Extraocular movements intact.     Right eye: Normal extraocular motion.     Left eye: Normal extraocular motion.     Conjunctiva/sclera: Conjunctivae normal.  Cardiovascular:     Rate and Rhythm: Normal rate.  Pulmonary:     Effort: Pulmonary effort is normal.  Musculoskeletal:     Cervical back: Normal range of motion and neck supple.  Lymphadenopathy:      Cervical: No cervical adenopathy.  Skin:    General: Skin is warm and dry.  Neurological:     General: No focal deficit present.     Mental Status: She is alert and oriented to person, place, and time.  Psychiatric:        Mood and Affect: Mood normal.        Behavior: Behavior normal.     Assessment and Plan :   PDMP not reviewed this encounter.  1. Otitis media of right ear with rupture of tympanic membrane   2. Viral respiratory infection    Start cefdinir to cover for otitis  media. Use supportive care otherwise. Follow up with ENT given the TM rupture. Counseled patient on potential for adverse effects with medications prescribed/recommended today, ER and return-to-clinic precautions discussed, patient verbalized understanding.    Wallis Bamberg, [place] 10/24/22 1910

## 2022-10-24 NOTE — ED Triage Notes (Signed)
Pt states she has a sinus infection x  4-5 days:  ear pressure, possible ear drum rupture and thick yellow/green drainage x 1 day. Fluticasone gives no relief.

## 2022-10-28 ENCOUNTER — Encounter: Payer: Self-pay | Admitting: Nurse Practitioner

## 2022-10-28 ENCOUNTER — Ambulatory Visit: Payer: BC Managed Care – PPO | Admitting: Nurse Practitioner

## 2022-10-28 VITALS — BP 107/69 | HR 70 | Temp 97.1°F | Ht 62.0 in | Wt 127.2 lb

## 2022-10-28 DIAGNOSIS — H66014 Acute suppurative otitis media with spontaneous rupture of ear drum, recurrent, right ear: Secondary | ICD-10-CM | POA: Insufficient documentation

## 2022-10-28 MED ORDER — LEVOFLOXACIN 500 MG PO TABS: 500.0000 mg | ORAL_TABLET | Freq: Every day | ORAL | 0 refills | Status: DC

## 2022-10-28 MED ORDER — DOXYCYCLINE HYCLATE 100 MG PO TABS: 100.0000 mg | ORAL_TABLET | Freq: Two times a day (BID) | ORAL | 0 refills | Status: DC

## 2022-10-28 NOTE — Patient Instructions (Addendum)
1. Recurrent acute suppurative otitis media of right ear with spontaneous rupture of tympanic membrane  - Ambulatory referral to ENT - doxycycline (VIBRA-TABS) 100 MG tablet; Take 1 tablet (100 mg total) by mouth 2 (two) times daily.  Dispense: 14 tablet; Refill: 0   It is important that you exercise regularly at least 30 minutes 5 times a week as tolerated  Think about what you will eat, plan ahead. Choose " clean, green, fresh or frozen" over canned, processed or packaged foods which are more sugary, salty and fatty. 70 to 75% of food eaten should be vegetables and fruit. Three meals at set times with snacks allowed between meals, but they must be fruit or vegetables. Aim to eat over a 12 hour period , example 7 am to 7 pm, and STOP after  your last meal of the day. Drink water,generally about 64 ounces per day, no other drink is as healthy. Fruit juice is best enjoyed in a healthy way, by EATING the fruit.  Thanks for choosing [facility] we consider it a privelige to serve you.

## 2022-10-28 NOTE — Progress Notes (Addendum)
New Patient Office Visit  Subjective:  Patient ID: Ashley Greene, female    DOB: 1990/11/28  Age: 32 y.o. MRN: [id number]  CC:  Chief Complaint  Patient presents with   Establish Care    Not fasting     HPI Ashley Greene is a 32 y.o. female with past medical history of HSV-2 infection presents for establishing care.  Previous PCP was at [facility].   She had when to the urgent care on 10/24/2022 for complaints of persistent sinus pressure, sinus drainage, ear pressure,.  States that she does have history of ear infection and rupture as a child.  She has not seen an ENT specialist for her condition.  She was prescribed Omnicef but she states she stopped taking the medication after 2 days of starting due to severe nausea.  Patient currently denies fever, chills, sneezing, coughing, rhinorrhea.  Reports trouble hearing bilaterally.  She continues to take Zyrtec 10 mg daily Flonase nasal spray 2 spray into both nostrils daily    Past Medical History:  Diagnosis Date   Herpes simplex virus (HSV) infection    Medical history non-contributory    Positive GBS test 04/27/2020   Declines antibiotics, counseled   Post term pregnancy over 40 weeks 05/19/2020   Rubella immune 11/11/2019   Supervision of normal first pregnancy, antepartum 10/15/2019    Nursing Staff Provider Office Location [facility] Dating   LMP Language  English  Anatomy US   normal Flu Vaccine  Declined-11/11/19 Genetic Screen  NIPS: normal female AFP: negtive  First Screen:  Quad:   TDaP vaccine  03/17/20 Hgb A1C or  GTT Early  n/a Third trimester neg Rhogam  02/25/20   LAB RESULTS    Blood Type   ONeg Feeding Plan Breast  Antibody  Neg Contraception None Rubella   Immune Circumc    Past Surgical History:  Procedure Laterality Date   WISDOM TOOTH EXTRACTION      Family History  Problem Relation Age of Onset   Healthy Mother    Healthy Father    Liver cancer Paternal Grandfather    Heart attack Paternal Grandmother    Stroke  Paternal Grandmother    Dementia Paternal Grandmother    Bone cancer Maternal Grandfather    Healthy Half-Brother    Healthy Half-Sister     Social History   Socioeconomic History   Marital status: Single    Spouse name: Not on file   Number of children: 1   Years of education: Not on file   Highest education level: Not on file  Occupational History   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  Tobacco Use   Smoking status: Never   Smokeless tobacco: Never  Vaping Use   Vaping Use: Never used  Substance and Sexual Activity   Alcohol use: No   Drug use: No   Sexual activity: Yes    Birth control/protection: None  Other Topics Concern   Not on file  Social History Narrative   Lives alone with her daughter.    Social Determinants of Health   Financial Resource Strain: Low Risk  (09/09/2020)   Overall Financial Resource Strain (CARDIA)    Difficulty of Paying Living Expenses: Not hard at all  Food Insecurity: No Food Insecurity (09/09/2020)   Hunger Vital Sign    Worried About Running Out of Food in the Last Year: Never true    Ran Out of Food in the Last Year: Never true  Transportation Needs: No Transportation Needs (09/09/2020)   PRAPARE - Administrator, Civil Service (Medical): No    Lack of Transportation (Non-Medical): No  Physical Activity: Insufficiently Active (09/09/2020)   Exercise Vital Sign    Days of Exercise per Week: 2 days    Minutes of Exercise per Session: 60 min  Stress: No Stress Concern Present (09/09/2020)   [employer] of Occupational Health - Occupational Stress Questionnaire    Feeling of Stress : Not at all  Social Connections: Unknown (09/09/2020)   Social Connection and Isolation Panel [NHANES]    Frequency of Communication with Friends and Family: More than three times a week    Frequency of Social Gatherings with Friends and Family: More than three times a week    Attends Religious Services: Patient  declined    Database administrator or Organizations: Yes    Attends Engineer, structural: More than 4 times per year    Marital Status: Living with partner  Intimate Partner Violence: Not At Risk (09/09/2020)   Humiliation, Afraid, Rape, and Kick questionnaire    Fear of Current or Ex-Partner: No    Emotionally Abused: No    Physically Abused: No    Sexually Abused: No    ROS Review of Systems  Constitutional:  Negative for activity change, appetite change, chills, fatigue and fever.  HENT:  Positive for ear pain. Negative for congestion, dental problem, ear discharge, hearing loss, rhinorrhea, sinus pressure, sinus pain, sneezing and sore throat.   Eyes:  Negative for pain, discharge, redness and itching.  Respiratory:  Negative for cough, chest tightness, shortness of breath and wheezing.   Cardiovascular:  Negative for chest pain, palpitations and leg swelling.  Gastrointestinal:  Negative for abdominal distention, abdominal pain, anal bleeding, blood in stool, constipation, diarrhea, nausea, rectal pain and vomiting.  Endocrine: Negative for cold intolerance, heat intolerance, polydipsia, polyphagia and polyuria.  Genitourinary:  Negative for difficulty urinating, dysuria, flank pain, frequency, hematuria, menstrual problem, pelvic pain and vaginal bleeding.  Musculoskeletal:  Negative for arthralgias, back pain, gait problem, joint swelling and myalgias.  Skin:  Negative for color change, pallor, rash and wound.  Allergic/Immunologic: Negative for environmental allergies, food allergies and immunocompromised state.  Neurological:  Negative for dizziness, tremors, facial asymmetry, weakness and headaches.  Hematological:  Negative for adenopathy. Does not bruise/bleed easily.  Psychiatric/Behavioral:  Negative for agitation, behavioral problems, confusion, decreased concentration, hallucinations, self-injury and suicidal ideas.     Objective:   Today's Vitals: BP 107/69    Pulse 70   Temp (!) 97.1 F (36.2 C)   Ht 5\' 2"  (1.575 m)   Wt 127 lb 3.2 oz (57.7 kg)   LMP 10/17/2022 (Exact Date)   SpO2 99%   BMI 23.27 kg/m   Physical Exam Vitals and nursing note reviewed.  Constitutional:      General: She is not in acute distress.    Appearance: Normal appearance. She is not ill-appearing, toxic-appearing or diaphoretic.  HENT:     Right Ear: Ear canal and external ear normal. There is no impacted cerumen.     Left Ear: Ear canal and external ear normal. There is no impacted cerumen.     Ears:     Comments: Middle ear effusion present on the right ear, unable to completely visualize TM on the right.  TM on the left appears normal, no effusion noted    Nose: No congestion or rhinorrhea.     Mouth/Throat:  Mouth: Mucous membranes are moist.     Pharynx: Oropharynx is clear. No oropharyngeal exudate or posterior oropharyngeal erythema.  Eyes:     General: No scleral icterus.       Right eye: No discharge.        Left eye: No discharge.     Extraocular Movements: Extraocular movements intact.     Conjunctiva/sclera: Conjunctivae normal.  Cardiovascular:     Rate and Rhythm: Normal rate and regular rhythm.     Pulses: Normal pulses.     Heart sounds: Normal heart sounds. No murmur heard.    No friction rub. No gallop.  Pulmonary:     Effort: Pulmonary effort is normal. No respiratory distress.     Breath sounds: Normal breath sounds. No stridor. No wheezing, rhonchi or rales.  Chest:     Chest wall: No tenderness.  Abdominal:     General: There is no distension.     Palpations: Abdomen is soft.     Tenderness: There is no abdominal tenderness. There is no right CVA tenderness, left CVA tenderness or guarding.  Musculoskeletal:        General: No swelling, tenderness, deformity or signs of injury.     Right lower leg: No edema.     Left lower leg: No edema.  Skin:    General: Skin is warm and dry.     Capillary Refill: Capillary refill takes  less than 2 seconds.     Coloration: Skin is not jaundiced or pale.     Findings: No bruising, erythema or lesion.  Neurological:     Mental Status: She is alert and oriented to person, place, and time.     Motor: No weakness.     Coordination: Coordination normal.     Gait: Gait normal.  Psychiatric:        Mood and Affect: Mood normal.        Behavior: Behavior normal.        Thought Content: Thought content normal.        Judgment: Judgment normal.     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 Recurrent acute suppurative otitis media of right ear with spontaneous rupture of tympanic membrane  - Ambulatory referral to ENT - doxycycline (VIBRA-TABS) 100 MG tablet; Take 1 tablet (100 mg total) by mouth 2 (two) times daily.  Dispense: 14 tablet; Refill: 0  Omnicef discontinued  Continue Flonase and Zyrtec      Relevant Medications   doxycycline (VIBRA-TABS) 100 MG tablet   Other Relevant Orders   Ambulatory referral to ENT    Outpatient Encounter Medications as of 10/28/2022  Medication Sig   acyclovir (ZOVIRAX) 400 MG tablet Take 1 tablet (400 mg total) by mouth 2 (two) times daily.   cetirizine (ZYRTEC ALLERGY) 10 MG tablet Take 1 tablet (10 mg total) by mouth daily.   doxycycline (VIBRA-TABS) 100 MG tablet Take 1 tablet (100 mg total) by mouth 2 (two) times daily.   fluticasone (FLONASE) 50 MCG/ACT nasal spray Place 2 sprays into both nostrils daily.   [DISCONTINUED] levofloxacin (LEVAQUIN) 500 MG tablet Take 1 tablet (500 mg total) by mouth daily for 7 days.   albuterol (VENTOLIN HFA) 108 (90 Base) MCG/ACT inhaler Inhale 2 puffs into the lungs every 6 (six) hours  as needed for wheezing or shortness of breath. (Patient not taking: Reported on 10/28/2022)   benzonatate (TESSALON PERLES) 100 MG capsule Take 1  capsule (100 mg total) by mouth 3 (three) times daily as needed. (Patient not taking: Reported on 10/28/2022)   erythromycin ophthalmic ointment Place 1 Application into the right eye in the morning and at bedtime. (Patient not taking: Reported on 10/28/2022)   ibuprofen (ADVIL) 800 MG tablet Take 1 tablet (800 mg total) by mouth every 8 (eight) hours as needed. (Patient not taking: Reported on 12/09/2021)   Prenatal Vit-Fe Fumarate-FA (PRENATAL VITAMINS PO) Take by mouth. (Patient not taking: Reported on 12/09/2021)   [DISCONTINUED] augmented betamethasone dipropionate (DIPROLENE-AF) 0.05 % cream Apply topically 2 (two) times daily. (Patient not taking: Reported on 02/13/2022)   [DISCONTINUED] cefdinir (OMNICEF) 300 MG capsule Take 1 capsule (300 mg total) by mouth 2 (two) times daily. (Patient not taking: Reported on 10/28/2022)   [DISCONTINUED] Lidocaine (HM LIDOCAINE PATCH) 4 % PTCH Apply 1 each topically daily as needed. (Patient not taking: Reported on 12/09/2021)   [DISCONTINUED] mupirocin ointment (BACTROBAN) 2 % Apply 1 application topically 2 (two) times daily. (Patient not taking: Reported on 12/09/2021)   [DISCONTINUED] predniSONE (STERAPRED UNI-PAK 21 TAB) 10 MG (21) TBPK tablet Tablet as directed (Patient not taking: Reported on 10/28/2022)   [DISCONTINUED] pseudoephedrine (SUDAFED) 60 MG tablet Take 1 tablet (60 mg total) by mouth every 8 (eight) hours as needed for congestion. (Patient not taking: Reported on 10/28/2022)   [DISCONTINUED] triamcinolone cream (KENALOG) 0.1 % Apply 1 application topically 2 (two) times daily. (Patient not taking: Reported on 12/09/2021)   [DISCONTINUED] trimethoprim-polymyxin b (POLYTRIM) ophthalmic solution Place 1 drop into the right eye every 4 (four) hours. 5 days (Patient not taking: Reported on 10/28/2022)   No facility-administered encounter medications on file as of 10/28/2022.    Follow-up: No follow-ups on file.   Donell Beers, FNP

## 2022-10-28 NOTE — Assessment & Plan Note (Addendum)
1. Recurrent acute suppurative otitis media of right ear with spontaneous rupture of tympanic membrane  - Ambulatory referral to ENT - doxycycline (VIBRA-TABS) 100 MG tablet; Take 1 tablet (100 mg total) by mouth 2 (two) times daily.  Dispense: 14 tablet; Refill: 0  Omnicef discontinued  Continue Flonase and Zyrtec

## 2022-11-21 DIAGNOSIS — H02112 Cicatricial ectropion of right lower eyelid: Secondary | ICD-10-CM | POA: Diagnosis not present

## 2022-11-21 DIAGNOSIS — H0279 Other degenerative disorders of eyelid and periocular area: Secondary | ICD-10-CM | POA: Diagnosis not present

## 2022-11-21 DIAGNOSIS — L905 Scar conditions and fibrosis of skin: Secondary | ICD-10-CM | POA: Diagnosis not present

## 2022-11-21 DIAGNOSIS — H02532 Eyelid retraction right lower eyelid: Secondary | ICD-10-CM | POA: Diagnosis not present

## 2022-12-07 ENCOUNTER — Ambulatory Visit: Payer: Self-pay | Admitting: Nurse Practitioner

## 2022-12-20 ENCOUNTER — Ambulatory Visit: Payer: BC Managed Care – PPO | Admitting: Nurse Practitioner

## 2022-12-20 ENCOUNTER — Other Ambulatory Visit (HOSPITAL_COMMUNITY): Admission: RE | Admit: 2022-12-20 | Payer: BC Managed Care – PPO | Source: Ambulatory Visit

## 2022-12-20 ENCOUNTER — Encounter: Payer: Self-pay | Admitting: Nurse Practitioner

## 2022-12-20 VITALS — BP 102/60 | HR 63 | Temp 97.3°F | Ht 62.0 in | Wt 129.6 lb

## 2022-12-20 DIAGNOSIS — Z0000 Encounter for general adult medical examination without abnormal findings: Secondary | ICD-10-CM

## 2022-12-20 DIAGNOSIS — Z1329 Encounter for screening for other suspected endocrine disorder: Secondary | ICD-10-CM

## 2022-12-20 DIAGNOSIS — Z124 Encounter for screening for malignant neoplasm of cervix: Secondary | ICD-10-CM | POA: Diagnosis not present

## 2022-12-20 DIAGNOSIS — Z1321 Encounter for screening for nutritional disorder: Secondary | ICD-10-CM | POA: Insufficient documentation

## 2022-12-20 DIAGNOSIS — H66014 Acute suppurative otitis media with spontaneous rupture of ear drum, recurrent, right ear: Secondary | ICD-10-CM

## 2022-12-20 DIAGNOSIS — Z13228 Encounter for screening for other metabolic disorders: Secondary | ICD-10-CM | POA: Insufficient documentation

## 2022-12-20 DIAGNOSIS — Z130 Encounter for screening for diseases of the blood and blood-forming organs and certain disorders involving the immune mechanism: Secondary | ICD-10-CM

## 2022-12-20 NOTE — Patient Instructions (Signed)

## 2022-12-20 NOTE — Assessment & Plan Note (Signed)
Annual exam as documented.  Counseling done include healthy lifestyle involving committing to 150 minutes of exercise per week, heart healthy diet, and attaining healthy weight. The importance of adequate sleep also discussed.   Immunization and cancer screening  needs are specifically addressed at this visit.    Routine fasting labs ordered

## 2022-12-20 NOTE — Assessment & Plan Note (Signed)
Examination completed without difficulty She is requesting for STD testing, she has no concern for HIV and hep C infection

## 2022-12-20 NOTE — Progress Notes (Signed)
Complete physical exam  Patient: Ashley Greene   DOB: Feb 14, 1991   32 y.o. Female  MRN: [id number]  Subjective:    Chief Complaint  Patient presents with   Gynecologic Exam    Ashley Greene is a 32 y.o. female  has a past medical history of Herpes simplex virus (HSV) infection, Medical history non-contributory, Positive GBS test (04/27/2020), Post term pregnancy over 40 weeks (05/19/2020), Rubella immune (11/11/2019), and Supervision of normal first pregnancy, antepartum (10/15/2019). who presents today for a complete physical exam. She reports consuming a general diet. Walking and running for exercise 2-3 times weekly. She generally feels well. She reports sleeping well. She does not have additional problems to discuss today.   She has upcoming surgery for  recurrent stye of the right eye after having an injury to the right lower eye lid in the past.    Most recent fall risk assessment:    12/09/2021    9:34 AM  Fall Risk   Falls in the past year? 0  Number falls in past yr: 0  Injury with Fall? 0  Risk for fall due to : No Fall Risks  Follow up Falls evaluation completed     Most recent depression screenings:    12/20/2022   11:07 AM 10/28/2022    2:13 PM  PHQ 2/9 Scores  PHQ - 2 Score 0 0        Patient Care Team: Donell Beers, FNP as PCP - General (Nurse Practitioner)   Outpatient Medications Prior to Visit  Medication Sig   acyclovir (ZOVIRAX) 400 MG tablet Take 1 tablet (400 mg total) by mouth 2 (two) times daily.   cetirizine (ZYRTEC ALLERGY) 10 MG tablet Take 1 tablet (10 mg total) by mouth daily.   fluticasone (FLONASE) 50 MCG/ACT nasal spray Place 2 sprays into both nostrils daily.   ibuprofen (ADVIL) 800 MG tablet Take 1 tablet (800 mg total) by mouth every 8 (eight) hours as needed.   albuterol (VENTOLIN HFA) 108 (90 Base) MCG/ACT inhaler Inhale 2 puffs into the lungs every 6 (six) hours as needed for wheezing or shortness of breath. (Patient not  taking: Reported on 10/28/2022)   benzonatate (TESSALON PERLES) 100 MG capsule Take 1 capsule (100 mg total) by mouth 3 (three) times daily as needed. (Patient not taking: Reported on 10/28/2022)   Prenatal Vit-Fe Fumarate-FA (PRENATAL VITAMINS PO) Take by mouth. (Patient not taking: Reported on 12/09/2021)   [DISCONTINUED] doxycycline (VIBRA-TABS) 100 MG tablet Take 1 tablet (100 mg total) by mouth 2 (two) times daily. (Patient not taking: Reported on 12/20/2022)   [DISCONTINUED] erythromycin ophthalmic ointment Place 1 Application into the right eye in the morning and at bedtime. (Patient not taking: Reported on 10/28/2022)   No facility-administered medications prior to visit.    Review of Systems  Constitutional:  Negative for activity change, appetite change, chills, diaphoresis, fatigue, fever and unexpected weight change.  HENT:  Negative for congestion, dental problem, drooling, ear discharge, ear pain, facial swelling, hearing loss, mouth sores, nosebleeds, postnasal drip, rhinorrhea, sinus pressure, sinus pain, sneezing, sore throat and tinnitus.   Eyes:  Negative for pain, discharge, redness and itching.  Respiratory:  Negative for cough, choking, chest tightness, shortness of breath and wheezing.   Cardiovascular:  Negative for chest pain, palpitations and leg swelling.  Gastrointestinal:  Negative for abdominal distention, abdominal pain, anal bleeding, blood in stool, constipation, diarrhea, nausea, rectal pain and vomiting.  Endocrine: Negative for cold intolerance, heat intolerance, polydipsia,  polyphagia and polyuria.  Genitourinary:  Negative for difficulty urinating, dysuria, flank pain, frequency, genital sores and hematuria.  Musculoskeletal:  Negative for arthralgias, back pain, gait problem, joint swelling and myalgias.  Skin:  Negative for color change, pallor, rash and wound.  Allergic/Immunologic: Negative for environmental allergies, food allergies and immunocompromised  state.  Neurological:  Negative for dizziness, tremors, seizures, syncope, facial asymmetry, speech difficulty, weakness, light-headedness, numbness and headaches.  Hematological:  Negative for adenopathy. Does not bruise/bleed easily.  Psychiatric/Behavioral:  Negative for agitation, behavioral problems, confusion, hallucinations, self-injury, sleep disturbance and suicidal ideas. The patient is not nervous/anxious.        Objective:     BP 102/60   Pulse 63   Temp (!) 97.3 F (36.3 C)   Ht 5\' 2"  (1.575 m)   Wt 129 lb 9.6 oz (58.8 kg)   LMP 12/17/2022 (Exact Date)   SpO2 99%   BMI 23.70 kg/m    Physical Exam Vitals and nursing note reviewed. Exam conducted with a chaperone present.  Constitutional:      General: She is not in acute distress.    Appearance: Normal appearance. She is not ill-appearing, toxic-appearing or diaphoretic.  HENT:     Right Ear: Tympanic membrane, ear canal and external ear normal. There is no impacted cerumen.     Left Ear: Tympanic membrane, ear canal and external ear normal. There is no impacted cerumen.     Nose: Nose normal. No congestion or rhinorrhea.     Mouth/Throat:     Mouth: Mucous membranes are moist.     Pharynx: Oropharynx is clear. No oropharyngeal exudate or posterior oropharyngeal erythema.  Eyes:     General: No scleral icterus.       Right eye: No discharge.        Left eye: No discharge.     Extraocular Movements: Extraocular movements intact.     Conjunctiva/sclera: Conjunctivae normal.  Neck:     Vascular: No carotid bruit.  Cardiovascular:     Rate and Rhythm: Normal rate and regular rhythm.     Pulses: Normal pulses.     Heart sounds: Normal heart sounds. No murmur heard.    No friction rub. No gallop.  Pulmonary:     Effort: Pulmonary effort is normal. No respiratory distress.     Breath sounds: Normal breath sounds. No stridor. No wheezing, rhonchi or rales.  Chest:     Chest wall: No mass, lacerations, deformity,  swelling, tenderness or edema.  Breasts:    Tanner Score is 5.     Breasts are symmetrical.     Right: Normal. No swelling, bleeding, inverted nipple, mass, nipple discharge, skin change or tenderness.     Left: Normal. No swelling, bleeding, inverted nipple, mass, nipple discharge, skin change or tenderness.  Abdominal:     General: Bowel sounds are normal. There is no distension.     Palpations: Abdomen is soft. There is no mass.     Tenderness: There is no abdominal tenderness. There is no right CVA tenderness, left CVA tenderness, guarding or rebound.     Hernia: No hernia is present. There is no hernia in the left inguinal area or right inguinal area.  Genitourinary:    General: Normal vulva.     Exam position: Lithotomy position.     Pubic Area: No rash or pubic lice.      Tanner stage (genital): 5.     Labia:  Right: No rash, tenderness, lesion or injury.        Left: No rash, tenderness, lesion or injury.      Urethra: No prolapse, urethral pain, urethral swelling or urethral lesion.     Vagina: No signs of injury and foreign body. No vaginal discharge, erythema, tenderness, bleeding, lesions or prolapsed vaginal walls.     Cervix: No cervical motion tenderness, discharge, friability, lesion, erythema, cervical bleeding or eversion.     Uterus: Normal. Not enlarged, not fixed, not tender and no uterine prolapse.      Adnexa:        Right: No mass, tenderness or fullness.         Left: No mass, tenderness or fullness.    Musculoskeletal:        General: No swelling, tenderness, deformity or signs of injury.     Cervical back: Normal range of motion and neck supple. No rigidity or tenderness.     Right lower leg: No edema.     Left lower leg: No edema.  Lymphadenopathy:     Cervical: No cervical adenopathy.     Upper Body:     Right upper body: No supraclavicular, axillary or pectoral adenopathy.     Left upper body: No supraclavicular, axillary or pectoral adenopathy.      Lower Body: No right inguinal adenopathy. No left inguinal adenopathy.  Skin:    General: Skin is warm and dry.     Capillary Refill: Capillary refill takes less than 2 seconds.     Coloration: Skin is not jaundiced or pale.     Findings: No bruising, erythema, lesion or rash.  Neurological:     Mental Status: She is alert and oriented to person, place, and time.     Cranial Nerves: No cranial nerve deficit.     Sensory: No sensory deficit.     Motor: No weakness.     Coordination: Coordination normal.     Gait: Gait normal.     Deep Tendon Reflexes: Reflexes normal.  Psychiatric:        Mood and Affect: Mood normal.        Behavior: Behavior normal.        Thought Content: Thought content normal.        Judgment: Judgment normal.     No results found for any visits on 12/20/22.     Assessment & Plan:    Routine Health Maintenance and Physical Exam  Immunization History  Administered Date(s) Administered   Moderna Sars-Covid-2 Vaccination 03/12/2020, 04/20/2020   Tdap 03/17/2020    Health Maintenance  Topic Date Due   PAP SMEAR-Modifier  Never done   INFLUENZA VACCINE  01/19/2023   DTaP/Tdap/Td (2 - Td or Tdap) 03/17/2030   Hepatitis C Screening  Completed   HIV Screening  Completed   HPV VACCINES  Aged Out   COVID-19 Vaccine  Discontinued    Discussed health benefits of physical activity, and encouraged her to engage in regular exercise appropriate for her age and condition.  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  Counseling done include healthy lifestyle involving committing to 150 minutes of exercise per  week, heart healthy diet, and attaining healthy weight. The importance of adequate sleep also  discussed.   Immunization and cancer screening  needs are specifically addressed at this visit.    Routine fasting labs ordered      Screening for cervical cancer    Examination completed without difficulty She is requesting for STD testing, she has no concern for HIV and hep C infection      Relevant Orders   CBC with Differential/Platelet   CMP14+EGFR   TSH   Lipid panel   Cytology - PAP([place])   Other Visit Diagnoses     Screening for endocrine, nutritional, metabolic and immunity disorder       Relevant Orders   CBC with Differential/Platelet   CMP14+EGFR   TSH   Lipid panel   Cytology - PAP([place])      Return in about 1 year (around 12/20/2023) for CPE, FASTING LABS THIS WEEK.     Donell Beers, FNP

## 2022-12-20 NOTE — Assessment & Plan Note (Signed)
No complaints today Referral to ENT is pending

## 2022-12-22 ENCOUNTER — Other Ambulatory Visit: Payer: Self-pay | Admitting: Nurse Practitioner

## 2022-12-23 ENCOUNTER — Other Ambulatory Visit: Payer: Self-pay | Admitting: Nurse Practitioner

## 2022-12-23 DIAGNOSIS — B009 Herpesviral infection, unspecified: Secondary | ICD-10-CM

## 2022-12-23 MED ORDER — ACYCLOVIR 200 MG PO CAPS: ORAL_CAPSULE | ORAL | 2 refills | Status: AC

## 2022-12-26 ENCOUNTER — Other Ambulatory Visit: Payer: Self-pay

## 2022-12-26 MED ORDER — CETIRIZINE HCL 10 MG PO TABS: 10.0000 mg | ORAL_TABLET | Freq: Every day | ORAL | 0 refills | Status: DC

## 2022-12-27 ENCOUNTER — Other Ambulatory Visit: Payer: Self-pay

## 2022-12-27 LAB — CYTOLOGY - PAP
Adequacy: ABSENT
Chlamydia: NEGATIVE
Comment: NEGATIVE
Comment: NEGATIVE
Comment: NEGATIVE
Comment: NEGATIVE
Comment: NORMAL
Diagnosis: NEGATIVE
HSV1: NEGATIVE
HSV2: NEGATIVE
High risk HPV: NEGATIVE
Neisseria Gonorrhea: NEGATIVE
Trichomonas: NEGATIVE

## 2022-12-28 ENCOUNTER — Other Ambulatory Visit: Payer: Self-pay | Admitting: Nurse Practitioner

## 2022-12-28 MED ORDER — FLUCONAZOLE 150 MG PO TABS: 150.0000 mg | ORAL_TABLET | Freq: Every day | ORAL | 0 refills | Status: DC

## 2023-01-03 ENCOUNTER — Other Ambulatory Visit: Payer: BC Managed Care – PPO

## 2023-01-03 DIAGNOSIS — Z13228 Encounter for screening for other metabolic disorders: Secondary | ICD-10-CM | POA: Diagnosis not present

## 2023-01-03 DIAGNOSIS — Z124 Encounter for screening for malignant neoplasm of cervix: Secondary | ICD-10-CM | POA: Diagnosis not present

## 2023-01-03 DIAGNOSIS — Z1329 Encounter for screening for other suspected endocrine disorder: Secondary | ICD-10-CM | POA: Diagnosis not present

## 2023-01-03 DIAGNOSIS — Z136 Encounter for screening for cardiovascular disorders: Secondary | ICD-10-CM | POA: Diagnosis not present

## 2023-01-03 DIAGNOSIS — Z1321 Encounter for screening for nutritional disorder: Secondary | ICD-10-CM | POA: Diagnosis not present

## 2023-01-03 DIAGNOSIS — Z130 Encounter for screening for diseases of the blood and blood-forming organs and certain disorders involving the immune mechanism: Secondary | ICD-10-CM

## 2023-01-03 DIAGNOSIS — Z13220 Encounter for screening for lipoid disorders: Secondary | ICD-10-CM | POA: Diagnosis not present

## 2023-01-04 ENCOUNTER — Other Ambulatory Visit: Payer: Self-pay | Admitting: Nurse Practitioner

## 2023-01-04 DIAGNOSIS — R7989 Other specified abnormal findings of blood chemistry: Secondary | ICD-10-CM

## 2023-01-04 LAB — CMP14+EGFR
ALT: 5 IU/L (ref 0–32)
AST: 11 IU/L (ref 0–40)
Albumin: 4.5 g/dL (ref 4.0–5.0)
Alkaline Phosphatase: 68 IU/L (ref 44–121)
BUN/Creatinine Ratio: 7 — ABNORMAL LOW (ref 9–23)
BUN: 8 mg/dL (ref 6–20)
Bilirubin Total: 0.4 mg/dL (ref 0.0–1.2)
CO2: 19 mmol/L — ABNORMAL LOW (ref 20–29)
Calcium: 9.3 mg/dL (ref 8.7–10.2)
Chloride: 104 mmol/L (ref 96–106)
Creatinine, Ser: 1.2 mg/dL — ABNORMAL HIGH (ref 0.57–1.00)
Globulin, Total: 2.4 g/dL (ref 1.5–4.5)
Glucose: 94 mg/dL (ref 70–99)
Potassium: 3.9 mmol/L (ref 3.5–5.2)
Sodium: 138 mmol/L (ref 134–144)
Total Protein: 6.9 g/dL (ref 6.0–8.5)
eGFR: 62 mL/min/{1.73_m2} (ref >59)

## 2023-01-04 LAB — CBC WITH DIFFERENTIAL/PLATELET
Basophils Absolute: 0 10*3/uL (ref 0.0–0.2)
Basos: 1 %
EOS (ABSOLUTE): 0 10*3/uL (ref 0.0–0.4)
Eos: 1 %
Hematocrit: 38.2 % (ref 34.0–46.6)
Hemoglobin: 12.3 g/dL (ref 11.1–15.9)
Immature Grans (Abs): 0 10*3/uL (ref 0.0–0.1)
Immature Granulocytes: 0 %
Lymphocytes Absolute: 1.6 10*3/uL (ref 0.7–3.1)
Lymphs: 37 %
MCH: 32 pg (ref 26.6–33.0)
MCHC: 32.2 g/dL (ref 31.5–35.7)
MCV: 100 fL — ABNORMAL HIGH (ref 79–97)
Monocytes Absolute: 0.3 10*3/uL (ref 0.1–0.9)
Monocytes: 8 %
Neutrophils Absolute: 2.3 10*3/uL (ref 1.4–7.0)
Neutrophils: 53 %
Platelets: 222 10*3/uL (ref 150–450)
RBC: 3.84 x10E6/uL (ref 3.77–5.28)
RDW: 12.1 % (ref 11.7–15.4)
WBC: 4.3 10*3/uL (ref 3.4–10.8)

## 2023-01-04 LAB — LIPID PANEL
Chol/HDL Ratio: 2.5 ratio (ref 0.0–4.4)
Cholesterol, Total: 125 mg/dL (ref 100–199)
HDL: 50 mg/dL (ref >39)
LDL Chol Calc (NIH): 58 mg/dL (ref 0–99)
Triglycerides: 85 mg/dL (ref 0–149)
VLDL Cholesterol Cal: 17 mg/dL (ref 5–40)

## 2023-01-04 LAB — TSH: TSH: 0.607 u[IU]/mL (ref 0.450–4.500)

## 2023-01-06 DIAGNOSIS — L905 Scar conditions and fibrosis of skin: Secondary | ICD-10-CM | POA: Diagnosis not present

## 2023-01-06 DIAGNOSIS — H02532 Eyelid retraction right lower eyelid: Secondary | ICD-10-CM | POA: Diagnosis not present

## 2023-01-06 DIAGNOSIS — H16211 Exposure keratoconjunctivitis, right eye: Secondary | ICD-10-CM | POA: Diagnosis not present

## 2023-01-06 DIAGNOSIS — H02112 Cicatricial ectropion of right lower eyelid: Secondary | ICD-10-CM | POA: Diagnosis not present

## 2023-02-16 DIAGNOSIS — H019 Unspecified inflammation of eyelid: Secondary | ICD-10-CM | POA: Diagnosis not present

## 2023-04-05 ENCOUNTER — Ambulatory Visit
Admission: EM | Admit: 2023-04-05 | Discharge: 2023-04-05 | Disposition: A | Discharge status: Home / Self Care | Payer: BC Managed Care – PPO | Attending: Internal Medicine | Admitting: Internal Medicine

## 2023-04-05 ENCOUNTER — Ambulatory Visit (HOSPITAL_COMMUNITY): Payer: BC Managed Care – PPO

## 2023-04-05 DIAGNOSIS — J018 Other acute sinusitis: Secondary | ICD-10-CM | POA: Diagnosis not present

## 2023-04-05 DIAGNOSIS — L509 Urticaria, unspecified: Secondary | ICD-10-CM

## 2023-04-05 MED ORDER — CEFDINIR 300 MG PO CAPS: 300.0000 mg | ORAL_CAPSULE | Freq: Two times a day (BID) | ORAL | 0 refills | Status: DC

## 2023-04-05 MED ORDER — METHYLPREDNISOLONE 4 MG PO TBPK: ORAL_TABLET | ORAL | 0 refills | Status: DC

## 2023-04-05 MED ORDER — CETIRIZINE HCL 10 MG PO TABS: 10.0000 mg | ORAL_TABLET | Freq: Every day | ORAL | 0 refills | Status: DC

## 2023-04-05 NOTE — ED Provider Notes (Signed)
[facility]  Note:  This document was prepared using Conservation officer, historic buildings and may include unintentional dictation errors.  MRN: [id number] DOB: 1990-07-05  Subjective:   Ashley Greene is a 32 y.o. female presenting for 10-day history of persistent and worsening productive cough, sinus congestion and drainage, intermittent fevers.  In the past 2 days she has also gotten scattered hives all over her body.  These are coming and going.  Denies eating any new foods, starting new medications, exposure to poisonous plants, new hygiene products, new cleaning products or detergents.  She does have 2 dogs that go out into the woods and get into brush before coming back into the home.  That is the only possible inciting factor she can think of.  No history of asthma, respiratory disorders.  No smoking of any kind including cigarettes, cigars, vaping, marijuana use.    No current facility-administered medications for this encounter.  Current Outpatient Medications:    fluconazole (DIFLUCAN) 150 MG tablet, Take 1 tablet (150 mg total) by mouth daily., Disp: 1 tablet, Rfl: 0   acyclovir (ZOVIRAX) 200 MG capsule, Take 200 mg by mouth every 4 hours while awake(5 times daily) for 5 days.  Initiate at earliest sign of recurrence of genital herpez, Disp: 25 capsule, Rfl: 2   albuterol (VENTOLIN HFA) 108 (90 Base) MCG/ACT inhaler, Inhale 2 puffs into the lungs every 6 (six) hours as needed for wheezing or shortness of breath. (Patient not taking: Reported on 10/28/2022), Disp: 8 g, Rfl: 0   benzonatate (TESSALON PERLES) 100 MG capsule, Take 1 capsule (100 mg total) by mouth 3 (three) times daily as needed. (Patient not taking: Reported on 10/28/2022), Disp: 20 capsule, Rfl: 0   cetirizine (ZYRTEC ALLERGY) 10 MG tablet, Take 1 tablet (10 mg total) by mouth daily., Disp: 30 tablet, Rfl: 0   fluticasone (FLONASE) 50 MCG/ACT nasal spray, Place 2 sprays into both nostrils daily., Disp:  16 g, Rfl: 6   ibuprofen (ADVIL) 800 MG tablet, Take 1 tablet (800 mg total) by mouth every 8 (eight) hours as needed., Disp: 30 tablet, Rfl: 0   Prenatal Vit-Fe Fumarate-FA (PRENATAL VITAMINS PO), Take by mouth. (Patient not taking: Reported on 12/09/2021), Disp: , Rfl:    Allergies  Allergen Reactions   Amoxicillin Diarrhea    Stomach upset    Valtrex [Valacyclovir]     Stomach upset     Past Medical History:  Diagnosis Date   Herpes simplex virus (HSV) infection    Medical history non-contributory    Positive GBS test 04/27/2020   Declines antibiotics, counseled   Post term pregnancy over 40 weeks 05/19/2020   Rubella immune 11/11/2019   Supervision of normal first pregnancy, antepartum 10/15/2019    Nursing Staff Provider Office Location [facility] Dating   LMP Language  English  Anatomy US   normal Flu Vaccine  Declined-11/11/19 Genetic Screen  NIPS: normal female AFP: negtive  First Screen:  Quad:   TDaP vaccine  03/17/20 Hgb A1C or  GTT Early  n/a Third trimester neg Rhogam  02/25/20   LAB RESULTS    Blood Type   ONeg Feeding Plan Breast  Antibody  Neg Contraception None Rubella   Immune Circumc     Past Surgical History:  Procedure Laterality Date   WISDOM TOOTH EXTRACTION      Family History  Problem Relation Age of Onset   Healthy Mother    Healthy Father    Liver cancer Paternal  Grandfather    Heart attack Paternal Grandmother    Stroke Paternal Grandmother    Dementia Paternal Grandmother    Bone cancer Maternal Grandfather    Healthy Half-Brother    Healthy Half-Sister     Social History   Tobacco Use   Smoking status: Never   Smokeless tobacco: Never  Vaping Use   Vaping status: Never Used  Substance Use Topics   Alcohol use: No   Drug use: No    ROS   Objective:   Vitals: BP 104/71 (BP Location: Left Arm)   Pulse 94   Temp 99.2 F (37.3 C) (Oral)   Resp 20   LMP 04/01/2023   SpO2 97%   Physical Exam Constitutional:      General: She is not in  acute distress.    Appearance: Normal appearance. She is well-developed and normal weight. She is not ill-appearing, toxic-appearing or diaphoretic.  HENT:     Head: Normocephalic and atraumatic.     Right Ear: Tympanic membrane, ear canal and external ear normal. No drainage or tenderness. No middle ear effusion. There is no impacted cerumen. Tympanic membrane is not erythematous or bulging.     Left Ear: Tympanic membrane, ear canal and external ear normal. No drainage or tenderness.  No middle ear effusion. There is no impacted cerumen. Tympanic membrane is not erythematous or bulging.     Nose: Nose normal. No congestion or rhinorrhea.     Mouth/Throat:     Mouth: Mucous membranes are moist. No oral lesions.     Pharynx: No pharyngeal swelling, oropharyngeal exudate, posterior oropharyngeal erythema or uvula swelling.     Tonsils: No tonsillar exudate or tonsillar abscesses.  Eyes:     General: No scleral icterus.       Right eye: No discharge.        Left eye: No discharge.     Extraocular Movements: Extraocular movements intact.     Right eye: Normal extraocular motion.     Left eye: Normal extraocular motion.     Conjunctiva/sclera: Conjunctivae normal.  Cardiovascular:     Rate and Rhythm: Normal rate and regular rhythm.     Heart sounds: Normal heart sounds. No murmur heard.    No friction rub. No gallop.  Pulmonary:     Effort: Pulmonary effort is normal. No respiratory distress.     Breath sounds: No stridor. No wheezing, rhonchi or rales.  Chest:     Chest wall: No tenderness.  Musculoskeletal:     Cervical back: Normal range of motion and neck supple.  Lymphadenopathy:     Cervical: No cervical adenopathy.  Skin:    General: Skin is warm and dry.  Neurological:     General: No focal deficit present.     Mental Status: She is alert and oriented to person, place, and time.  Psychiatric:        Mood and Affect: Mood normal.        Behavior: Behavior normal.      Assessment and Plan :   PDMP not reviewed this encounter.  1. Acute non-recurrent sinusitis of other sinus   2. Urticaria    Will start empiric treatment for sinusitis with cefdinir.  Recommended supportive care otherwise.  Regarding her urticarial lesions, she did not have any present during clinic but I did offer her a methylprednisolone Dosepak.  Recommend that she avoid new exposures as much as possible.  Counseled patient on potential for adverse effects with medications  prescribed/recommended today, ER and return-to-clinic precautions discussed, patient verbalized understanding.    Wallis Bamberg, [place] 04/06/23 [phone]

## 2023-04-05 NOTE — Discharge Instructions (Signed)
We will manage this as a sinus infection with cefdinir. For sore throat or cough try using a honey-based tea. Use 3 teaspoons of honey with juice squeezed from half lemon. Place shaved pieces of ginger into 1/2-1 cup of water and warm over stove top. Then mix the ingredients and repeat every 4 hours as needed. Please take ibuprofen 600mg  every 6 hours with food alternating with OR taken together with Tylenol 650mg  every 6 hours for throat pain, fevers, aches and pains. Hydrate very well with at least 2 liters of water. Eat light meals such as soups (chicken and noodles, vegetable, chicken and wild rice).  Do not eat foods that you are allergic to.  Taking an antihistamine like Zyrtec can help against postnasal drainage, sinus congestion which can cause sinus pain, sinus headaches, throat pain, painful swallowing, coughing.  You can take this together with methylprednisolone for the hives. Avoid as many allergens as you possibly can.

## 2023-04-05 NOTE — ED Triage Notes (Signed)
Pt slightly prod cough x 10 days-intermittent fever-c/o scattered hives x 2 days-NAD-steady gait

## 2023-05-04 ENCOUNTER — Ambulatory Visit
Admission: EM | Admit: 2023-05-04 | Discharge: 2023-05-04 | Disposition: A | Discharge status: Home / Self Care | Payer: BC Managed Care – PPO | Attending: Internal Medicine | Admitting: Internal Medicine

## 2023-05-04 ENCOUNTER — Ambulatory Visit: Payer: BC Managed Care – PPO

## 2023-05-04 ENCOUNTER — Other Ambulatory Visit: Payer: Self-pay

## 2023-05-04 DIAGNOSIS — R059 Cough, unspecified: Secondary | ICD-10-CM | POA: Diagnosis not present

## 2023-05-04 DIAGNOSIS — J188 Other pneumonia, unspecified organism: Secondary | ICD-10-CM | POA: Diagnosis not present

## 2023-05-04 DIAGNOSIS — R0989 Other specified symptoms and signs involving the circulatory and respiratory systems: Secondary | ICD-10-CM | POA: Diagnosis not present

## 2023-05-04 DIAGNOSIS — J189 Pneumonia, unspecified organism: Secondary | ICD-10-CM

## 2023-05-04 MED ORDER — AZITHROMYCIN 250 MG PO TABS
ORAL_TABLET | ORAL | 0 refills | Status: DC
Start: 2023-05-04 — End: 2023-07-31

## 2023-05-04 MED ORDER — CEFDINIR 300 MG PO CAPS: 300.0000 mg | ORAL_CAPSULE | Freq: Two times a day (BID) | ORAL | 0 refills | Status: DC

## 2023-05-04 NOTE — ED Triage Notes (Signed)
Pt reports cough x 1 1/2 month; shortness of breath x 2-3 day. Pt had antibiotic for cough 2 weeks, ago, cough improved but is not completely gone.

## 2023-05-04 NOTE — ED Provider Notes (Signed)
[facility]  Note:  This document was prepared using Conservation officer, historic buildings and may include unintentional dictation errors.  MRN: [id number] DOB: November 18, 1990  Subjective:   Ashley Greene is a 32 y.o. female presenting for 1.5 month history of persistent productive cough, shortness of breath.  Patient was seen in mid October and underwent a Medrol Dosepak, cefdinir course given her intolerance with amoxicillin.  She did have improvement but not resolution and then this past week got significantly worse.  She has had significant exposure to pneumonia with her husband.  No asthma.  No smoking of any kind including cigarettes, cigars, vaping, marijuana use.    No current facility-administered medications for this encounter.  Current Outpatient Medications:    fluconazole (DIFLUCAN) 150 MG tablet, Take 1 tablet (150 mg total) by mouth daily., Disp: 1 tablet, Rfl: 0   acyclovir (ZOVIRAX) 200 MG capsule, Take 200 mg by mouth every 4 hours while awake(5 times daily) for 5 days.  Initiate at earliest sign of recurrence of genital herpez, Disp: 25 capsule, Rfl: 2   albuterol (VENTOLIN HFA) 108 (90 Base) MCG/ACT inhaler, Inhale 2 puffs into the lungs every 6 (six) hours as needed for wheezing or shortness of breath. (Patient not taking: Reported on 10/28/2022), Disp: 8 g, Rfl: 0   benzonatate (TESSALON PERLES) 100 MG capsule, Take 1 capsule (100 mg total) by mouth 3 (three) times daily as needed. (Patient not taking: Reported on 10/28/2022), Disp: 20 capsule, Rfl: 0   cefdinir (OMNICEF) 300 MG capsule, Take 1 capsule (300 mg total) by mouth 2 (two) times daily., Disp: 14 capsule, Rfl: 0   cetirizine (ZYRTEC ALLERGY) 10 MG tablet, Take 1 tablet (10 mg total) by mouth daily., Disp: 30 tablet, Rfl: 0   fluticasone (FLONASE) 50 MCG/ACT nasal spray, Place 2 sprays into both nostrils daily., Disp: 16 g, Rfl: 6   ibuprofen (ADVIL) 800 MG tablet, Take 1 tablet (800 mg total) by  mouth every 8 (eight) hours as needed., Disp: 30 tablet, Rfl: 0   methylPREDNISolone (MEDROL DOSEPAK) 4 MG TBPK tablet, Follow dose pack taper instructions., Disp: 21 tablet, Rfl: 0   Prenatal Vit-Fe Fumarate-FA (PRENATAL VITAMINS PO), Take by mouth. (Patient not taking: Reported on 12/09/2021), Disp: , Rfl:    Allergies  Allergen Reactions   Amoxicillin Diarrhea    Stomach upset    Valtrex [Valacyclovir]     Stomach upset     Past Medical History:  Diagnosis Date   Herpes simplex virus (HSV) infection    Medical history non-contributory    Positive GBS test 04/27/2020   Declines antibiotics, counseled   Post term pregnancy over 40 weeks 05/19/2020   Rubella immune 11/11/2019   Supervision of normal first pregnancy, antepartum 10/15/2019    Nursing Staff Provider Office Location [facility] Dating   LMP Language  English  Anatomy US   normal Flu Vaccine  Declined-11/11/19 Genetic Screen  NIPS: normal female AFP: negtive  First Screen:  Quad:   TDaP vaccine  03/17/20 Hgb A1C or  GTT Early  n/a Third trimester neg Rhogam  02/25/20   LAB RESULTS    Blood Type   ONeg Feeding Plan Breast  Antibody  Neg Contraception None Rubella   Immune Circumc     Past Surgical History:  Procedure Laterality Date   WISDOM TOOTH EXTRACTION      Family History  Problem Relation Age of Onset   Healthy Mother    Healthy Father  Liver cancer Paternal Grandfather    Heart attack Paternal Grandmother    Stroke Paternal Grandmother    Dementia Paternal Grandmother    Bone cancer Maternal Grandfather    Healthy Half-Brother    Healthy Half-Sister     Social History   Tobacco Use   Smoking status: Never   Smokeless tobacco: Never  Vaping Use   Vaping status: Never Used  Substance Use Topics   Alcohol use: No   Drug use: No    ROS   Objective:   Vitals: BP 108/74 (BP Location: Left Arm)   Pulse 79   Temp 98 F (36.7 C) (Oral)   Resp 16   LMP 04/28/2023 (Exact Date)   SpO2 98%   Physical  Exam Constitutional:      General: She is not in acute distress.    Appearance: Normal appearance. She is well-developed. She is not ill-appearing, toxic-appearing or diaphoretic.  HENT:     Head: Normocephalic and atraumatic.     Nose: Nose normal.     Mouth/Throat:     Mouth: Mucous membranes are moist.  Eyes:     General: No scleral icterus.       Right eye: No discharge.        Left eye: No discharge.     Extraocular Movements: Extraocular movements intact.  Cardiovascular:     Rate and Rhythm: Normal rate and regular rhythm.     Heart sounds: Normal heart sounds. No murmur heard.    No friction rub. No gallop.  Pulmonary:     Effort: Pulmonary effort is normal. No respiratory distress.     Breath sounds: No stridor. Examination of the left-upper field reveals rales. Examination of the left-middle field reveals rales. Rales present. No wheezing or rhonchi.  Chest:     Chest wall: No tenderness.  Skin:    General: Skin is warm and dry.  Neurological:     General: No focal deficit present.     Mental Status: She is alert and oriented to person, place, and time.  Psychiatric:        Mood and Affect: Mood normal.        Behavior: Behavior normal.     Assessment and Plan :   PDMP not reviewed this encounter.  1. Community acquired pneumonia of left lung, unspecified part of lung    Will treat empirically for community-acquired pneumonia of the left side given her pulmonary exam.  X-ray over-read was pending at time of discharge, recommended follow up with only abnormal results. Otherwise will not call for negative over-read. Patient was in agreement.  As patient cannot tolerate amoxicillin at all, will repeat cefdinir and add azithromycin.  Recommend supportive care otherwise.  Counseled patient on potential for adverse effects with medications prescribed/recommended today, ER and return-to-clinic precautions discussed, patient verbalized understanding.    Wallis Bamberg,  PA-C 05/04/23 1815

## 2023-05-11 ENCOUNTER — Telehealth: Payer: Self-pay

## 2023-05-11 MED ORDER — ONDANSETRON 4 MG PO TBDP: 4.0000 mg | ORAL_TABLET | Freq: Four times a day (QID) | ORAL | 0 refills | Status: DC | PRN

## 2023-05-11 MED ORDER — FLUCONAZOLE 150 MG PO TABS: 150.0000 mg | ORAL_TABLET | Freq: Once | ORAL | 0 refills | Status: DC | PRN

## 2023-05-11 NOTE — Telephone Encounter (Signed)
Pt called regarding her reaction to Cefdinir and Azithromycin.  States she has had GI sxs, nausea and developed a yeast infection. Pt states she wanted to have something sent in.   Spoke to Medical Provider regarding patients concerns. Provider will be sending in zofran and abx to treat yeast infection.

## 2023-05-11 NOTE — Telephone Encounter (Signed)
Sent zofran for nausea and fluconazole for yeast infection

## 2023-05-15 ENCOUNTER — Encounter (HOSPITAL_COMMUNITY): Payer: Self-pay | Admitting: *Deleted

## 2023-05-15 ENCOUNTER — Emergency Department (HOSPITAL_COMMUNITY)
Admission: EM | Admit: 2023-05-15 | Discharge: 2023-05-15 | Disposition: A | Discharge status: Home / Self Care | Payer: BC Managed Care – PPO | Attending: Emergency Medicine | Admitting: Emergency Medicine

## 2023-05-15 ENCOUNTER — Other Ambulatory Visit: Payer: Self-pay

## 2023-05-15 DIAGNOSIS — H6691 Otitis media, unspecified, right ear: Secondary | ICD-10-CM | POA: Diagnosis not present

## 2023-05-15 DIAGNOSIS — H6501 Acute serous otitis media, right ear: Secondary | ICD-10-CM | POA: Diagnosis not present

## 2023-05-15 DIAGNOSIS — H9201 Otalgia, right ear: Secondary | ICD-10-CM | POA: Diagnosis not present

## 2023-05-15 MED ORDER — AMOXICILLIN-POT CLAVULANATE 875-125 MG PO TABS: 1.0000 | ORAL_TABLET | Freq: Two times a day (BID) | ORAL | 0 refills | Status: AC

## 2023-05-15 MED ORDER — HYDROCODONE-ACETAMINOPHEN 5-325 MG PO TABS
1.0000 | ORAL_TABLET | Freq: Once | ORAL | Status: DC
  Filled 2023-05-15: qty 1

## 2023-05-15 MED ORDER — AMOXICILLIN-POT CLAVULANATE 875-125 MG PO TABS
1.0000 | ORAL_TABLET | Freq: Once | ORAL | Status: AC
  Administered 2023-05-15: 1 via ORAL
  Filled 2023-05-15: qty 1

## 2023-05-15 MED ORDER — FLUCONAZOLE 150 MG PO TABS: 150.0000 mg | ORAL_TABLET | Freq: Every day | ORAL | 0 refills | Status: DC

## 2023-05-15 NOTE — ED Triage Notes (Signed)
C/o right earache onset 2-3 hours ago states she was awaken from sleep, recently tx'd. For pneumonia however had to stop antibiotic early was having a reaction.

## 2023-05-15 NOTE — Discharge Instructions (Addendum)
Motrin and Tylenol as needed as directed for ear pain. Warm compress for 20 minutes at a time for pain.  You can hold the antibiotics for 48 hours and see if pain resolves without antibiotics. Take Augmentin as prescribed and complete the full course. Augmentin is likely to cause diarrhea but there are few options with the antibiotics you have recently taken. Recommend a probiotic, yogurt with probiotics. You can take Imodium if needed.   Take Diflucan if you develop a yeast infection.  Recheck with your doctor.

## 2023-05-15 NOTE — ED Provider Notes (Signed)
[place] EMERGENCY DEPARTMENT AT [facility] Provider Note   CSN: [id number] Arrival date & time: 05/15/23  2671     History  Chief Complaint  Patient presents with   Otalgia    Ashley Greene is a 32 y.o. female.  32 year old female presents with complaint of right ear pain which woke her from her sleep today. Patient took IBU and applied a warm compress without resolution of her pain. States she was recently treated for pneumonia with Cefdinir and Zithromax but had to stop the antibiotics after 5 days due to severe diarrhea and a yeast infection. No fevers. No other complaints.        Home Medications Prior to Admission medications   Medication Sig Start Date End Date Taking? Authorizing Provider  amoxicillin-clavulanate (AUGMENTIN) 875-125 MG tablet Take 1 tablet by mouth every 12 (twelve) hours for 10 days. 05/15/23 05/25/23 Yes Jeannie Fend, PA-C  fluconazole (DIFLUCAN) 150 MG tablet Take 1 tablet (150 mg total) by mouth daily. 05/15/23  Yes Jeannie Fend, PA-C  acyclovir (ZOVIRAX) 200 MG capsule Take 200 mg by mouth every 4 hours while awake(5 times daily) for 5 days.  Initiate at earliest sign of recurrence of genital herpez 12/23/22   Paseda, Baird Kay, FNP  albuterol (VENTOLIN HFA) 108 (90 Base) MCG/ACT inhaler Inhale 2 puffs into the lungs every 6 (six) hours as needed for wheezing or shortness of breath. Patient not taking: Reported on 10/28/2022 06/20/22   Viviano Simas, FNP  azithromycin (ZITHROMAX) 250 MG tablet Day 1: take 2 tablets. Day 2-5: Take 1 tablet daily. 05/04/23   Wallis Bamberg, PA-C  benzonatate (TESSALON PERLES) 100 MG capsule Take 1 capsule (100 mg total) by mouth 3 (three) times daily as needed. Patient not taking: Reported on 10/28/2022 10/22/22   Bennie Pierini, FNP  cefdinir (OMNICEF) 300 MG capsule Take 1 capsule (300 mg total) by mouth 2 (two) times daily. 05/04/23   Wallis Bamberg, PA-C  cetirizine (ZYRTEC ALLERGY) 10 MG tablet Take 1  tablet (10 mg total) by mouth daily. 04/05/23   Wallis Bamberg, PA-C  fluticasone (FLONASE) 50 MCG/ACT nasal spray Place 2 sprays into both nostrils daily. 10/22/22   Daphine Deutscher, Mary-Margaret, FNP  ibuprofen (ADVIL) 800 MG tablet Take 1 tablet (800 mg total) by mouth every 8 (eight) hours as needed. 05/25/20   Gita Kudo, MD  methylPREDNISolone (MEDROL DOSEPAK) 4 MG TBPK tablet Follow dose pack taper instructions. 04/05/23   Wallis Bamberg, PA-C  ondansetron (ZOFRAN-ODT) 4 MG disintegrating tablet Take 1 tablet (4 mg total) by mouth every 6 (six) hours as needed for nausea or vomiting. 05/11/23   Rising, Lurena Joiner, PA-C  Prenatal Vit-Fe Fumarate-FA (PRENATAL VITAMINS PO) Take by mouth. Patient not taking: Reported on 12/09/2021    [provider]      Allergies    Amoxicillin and Valtrex [valacyclovir]    Review of Systems   Review of Systems Negative except as per HPI Physical Exam Updated Vital Signs BP 97/86   Pulse 99   Temp (!) 97.3 F (36.3 C)   Resp 16   Ht 5\' 2"  (1.575 m)   Wt 56.7 kg   LMP 04/28/2023 (Exact Date)   SpO2 100%   BMI 22.86 kg/m  Physical Exam Vitals and nursing note reviewed.  Constitutional:      General: She is not in acute distress.    Appearance: She is well-developed. She is not diaphoretic.  HENT:     Head:  Normocephalic and atraumatic.     Right Ear: Ear canal normal. A middle ear effusion is present. Tympanic membrane is injected, erythematous and bulging.     Left Ear: Tympanic membrane and ear canal normal.     Nose: Nose normal.     Mouth/Throat:     Mouth: Mucous membranes are moist.  Pulmonary:     Effort: Pulmonary effort is normal.  Musculoskeletal:     Cervical back: Neck supple.  Lymphadenopathy:     Cervical: No cervical adenopathy.  Skin:    General: Skin is warm and dry.  Neurological:     Mental Status: She is alert and oriented to person, place, and time.  Psychiatric:        Behavior: Behavior normal.     ED Results  / Procedures / Treatments   Labs (all labs ordered are listed, but only abnormal results are displayed) Labs Reviewed - No data to display  EKG None  Radiology No results found.  Procedures Procedures    Medications Ordered in ED Medications  amoxicillin-clavulanate (AUGMENTIN) 875-125 MG per tablet 1 tablet (has no administration in time range)  HYDROcodone-acetaminophen (NORCO/VICODIN) 5-325 MG per tablet 1 tablet (has no administration in time range)    ED Course/ Medical Decision Making/ A&P                                 Medical Decision Making  32 year old female with recent PNA now with right ear pain. On exam, has right OM. Can hold abx for 48 hours and continue with motrin and tylenol OR can start Augmentin. Unfortunately, has diarrhea with amoxicillins, cephalosporins and zithromax. Due to recent abx for PNA, limited tx options for her OM today. Will rx augmentin, recommend probiotics and imodium if needed. Diflucan to hold if she develops a yeast infection. Recheck with PCP. Regarding recent PNA, patient is aware of recommendation for repeat CXR in 6 weeks. Advised to return for fever, worsening or concerning symptoms.         Final Clinical Impression(s) / ED Diagnoses Final diagnoses:  Non-recurrent acute serous otitis media of right ear    Rx / DC Orders ED Discharge Orders          Ordered    amoxicillin-clavulanate (AUGMENTIN) 875-125 MG tablet  Every 12 hours        05/15/23 0601    fluconazole (DIFLUCAN) 150 MG tablet  Daily        05/15/23 0601              Jeannie Fend, PA-C 05/15/23 1610    Nira Conn, MD 05/15/23 [phone]

## 2023-06-09 ENCOUNTER — Telehealth: Payer: Self-pay

## 2023-06-09 NOTE — Telephone Encounter (Signed)
Copied from CRM [id number]. Topic: Clinical - Lab/Test Results >> Jun 08, 2023  1:53 PM Monisha R wrote: Reason for CRM: Patient wants to know was she misdiagnosed, wouldn't tell me for what. Patient wants call back about more recent results.   Callled pt back and was sent to VM . LVM for pt to call back. Marland KitchenKH

## 2023-06-12 ENCOUNTER — Telehealth: Payer: Self-pay

## 2023-06-12 NOTE — Telephone Encounter (Signed)
Copied from CRM [id number]. Topic: Clinical - Lab/Test Results >> Jun 09, 2023  4:29 PM Dennison Nancy wrote: Reason for CRM: Patient returning a call from a nurse regarding a diagnosis  not sure of the name and also discuss previous lab work patient call around 4:30 on  06/09/23 Please call back # [phone]

## 2023-06-12 NOTE — Telephone Encounter (Unsigned)
Copied from CRM [id number]. Topic: Clinical - Lab/Test Results >> Jun 12, 2023 12:42 PM Antony Haste wrote: Reason for CRM: Patient returning a call from 12/20 from a nurse regarding a diagnosis that's concerning she states, unsure of the name and also discuss previous lab work.  Callback [phone]

## 2023-06-13 NOTE — Telephone Encounter (Signed)
Pt called back and was scheduled.KH

## 2023-06-16 ENCOUNTER — Other Ambulatory Visit: Payer: Self-pay

## 2023-06-16 NOTE — Telephone Encounter (Signed)
Completed.

## 2023-07-31 ENCOUNTER — Ambulatory Visit
Admission: EM | Admit: 2023-07-31 | Discharge: 2023-07-31 | Disposition: A | Discharge status: Home / Self Care | Payer: BC Managed Care – PPO | Attending: Nurse Practitioner | Admitting: Nurse Practitioner

## 2023-07-31 ENCOUNTER — Other Ambulatory Visit: Payer: Self-pay

## 2023-07-31 ENCOUNTER — Encounter: Payer: Self-pay | Admitting: Emergency Medicine

## 2023-07-31 DIAGNOSIS — J069 Acute upper respiratory infection, unspecified: Secondary | ICD-10-CM

## 2023-07-31 LAB — POC COVID19/FLU A&B COMBO
Covid Antigen, POC: NEGATIVE
Influenza A Antigen, POC: NEGATIVE
Influenza B Antigen, POC: NEGATIVE

## 2023-07-31 MED ORDER — BENZONATATE 100 MG PO CAPS: 100.0000 mg | ORAL_CAPSULE | Freq: Three times a day (TID) | ORAL | 0 refills | Status: DC | PRN

## 2023-07-31 MED ORDER — ONDANSETRON 4 MG PO TBDP: 4.0000 mg | ORAL_TABLET | Freq: Four times a day (QID) | ORAL | 0 refills | Status: AC | PRN

## 2023-07-31 NOTE — ED Triage Notes (Signed)
 Pt c/o fever, cough, body aches, vomiting for 2 days.  She has had pneumonia back in December and has not completely gotten rid of cough since then

## 2023-07-31 NOTE — Discharge Instructions (Signed)
 You have a viral upper respiratory infection.  Symptoms should improve over the next week to 10 days.  If you develop chest pain or shortness of breath, go to the emergency room.  COVID-19 and influenza testing is negative today.  Some things that can make you feel better are: - Increased rest - Increasing fluid with water/sugar free electrolytes - Acetaminophen  and ibuprofen  as needed for fever/pain - Salt water gargling, chloraseptic spray and throat lozenges - OTC guaifenesin (Mucinex) 600 mg twice daily - Saline sinus flushes or a neti pot - Humidifying the air - Zofran  every 8 hours as needed for nausea/vomiting

## 2023-07-31 NOTE — ED Provider Notes (Signed)
 [facility]    CSN: [id number] Arrival date & time: 07/31/23  1718      History   Chief Complaint Chief Complaint  Patient presents with   Cough    HPI Ashley Greene is a 33 y.o. female.   Patient presents today with 2-day history of tactile fever, body aches and chills, congested cough, coughing up mucus mixed with blood when she coughs, shortness of breath, chest tightness, stuffy nose, postnasal drainage, sore throat, headache, left ear pain that has not improved, nausea/vomiting overnight last night, loose stools, decreased appetite, and fatigue.  She has been tolerating fluids today well.  No chest pain, runny nose, abdominal pain.  Has taken Pepto-Bismol and ibuprofen  for symptoms with minimal temporary relief.  No known sick contacts.    Past Medical History:  Diagnosis Date   Herpes simplex virus (HSV) infection    Medical history non-contributory    Positive GBS test 04/27/2020   Declines antibiotics, counseled   Post term pregnancy over 40 weeks 05/19/2020   Rubella immune 11/11/2019   Supervision of normal first pregnancy, antepartum 10/15/2019    Nursing Staff Provider Office Location [facility] Dating   LMP Language  English  Anatomy US    normal Flu Vaccine  Declined-11/11/19 Genetic Screen  NIPS: normal female AFP: negtive  First Screen:  Quad:   TDaP vaccine  03/17/20 Hgb A1C or  GTT Early  n/a Third trimester neg Rhogam  02/25/20   LAB RESULTS    Blood Type   ONeg Feeding Plan Breast  Antibody  Neg Contraception None Rubella   Immune Circumc    Patient Active Problem List   Diagnosis Date Noted   Annual physical exam 12/20/2022   Screening for cervical cancer 12/20/2022   Recurrent acute suppurative otitis media of right ear with spontaneous rupture of tympanic membrane 10/28/2022   Rash and other nonspecific skin eruption 12/09/2021   Patient desires pregnancy 09/09/2020   Encounter to establish care 08/25/2020   Back pain 08/25/2020   Rh negative state  in antepartum period 11/11/2019   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 Authorizing Provider  acyclovir  (ZOVIRAX ) 200 MG capsule Take 200 mg by mouth every 4 hours while awake(5 times daily) for 5 days.  Initiate at earliest sign of recurrence of genital herpez Patient not taking: Reported on 07/31/2023 12/23/22   Paseda, Folashade R, FNP  albuterol  (VENTOLIN  HFA) 108 (90 Base) MCG/ACT inhaler Inhale 2 puffs into the lungs every 6 (six) hours as needed for wheezing or shortness of breath. Patient not taking: Reported on 10/28/2022 06/20/22   Mardene Shake, FNP  benzonatate  (TESSALON  PERLES) 100 MG capsule Take 1 capsule (100 mg total) by mouth 3 (three) times daily as needed. Do not take with alcohol or while driving or operating heavy machinery.  May cause drowsiness. 07/31/23  Wilhemena Harbour, NP  cetirizine  (ZYRTEC  ALLERGY) 10 MG tablet Take 1 tablet (10 mg total) by mouth daily. 04/05/23   Adolph Hoop, PA-C  fluticasone  (FLONASE ) 50 MCG/ACT nasal spray Place 2 sprays into both nostrils daily. 10/22/22   Gaylyn Keas Mary-Margaret, FNP  ibuprofen  (ADVIL ) 800 MG tablet Take 1 tablet (800 mg total) by mouth every 8 (eight) hours as needed. 05/25/20   Florie Husband, MD  ondansetron  (ZOFRAN -ODT) 4 MG disintegrating tablet Take 1 tablet (4 mg total) by mouth every 6 (six) hours as needed for nausea or vomiting. 07/31/23   Wilhemena Harbour, NP  Prenatal Vit-Fe Fumarate-FA (PRENATAL VITAMINS PO) Take by mouth. Patient not taking: Reported on 12/09/2021    [provider]    Family History Family History  Problem Relation Age of Onset    Healthy Mother    Healthy Father    Liver cancer Paternal Grandfather    Heart attack Paternal Grandmother    Stroke Paternal Grandmother    Dementia Paternal Grandmother    Bone cancer Maternal Grandfather    Healthy Half-Brother    Healthy Half-Sister     Social History Social History   Tobacco Use   Smoking status: Never   Smokeless tobacco: Never  Vaping Use   Vaping status: Never Used  Substance Use Topics   Alcohol use: No   Drug use: No     Allergies   Amoxicillin  and Valtrex [valacyclovir]   Review of Systems Review of Systems Per HPI  Physical Exam Triage Vital Signs ED Triage Vitals  Encounter Vitals Group     BP 07/31/23 1927 (!) 103/54     Systolic BP Percentile --      Diastolic BP Percentile --      Pulse Rate 07/31/23 1927 83     Resp 07/31/23 1927 18     Temp 07/31/23 1927 98.3 F (36.8 C)     Temp Source 07/31/23 1927 Oral     SpO2 07/31/23 1927 98 %     Weight --      Height --      Head Circumference --      Peak Flow --      Pain Score 07/31/23 1932 2     Pain Loc --      Pain Education --      Exclude from Growth Chart --    No data found.  Updated Vital Signs BP (!) 103/54 (BP Location: Right Arm)   Pulse 83   Temp 98.3 F (36.8 C) (Oral)   Resp 18   LMP 07/23/2023 (Exact Date)   SpO2 98%   Visual Acuity Right Eye Distance:   Left Eye Distance:   Bilateral Distance:    Right Eye Near:   Left Eye Near:    Bilateral Near:     Physical Exam Vitals and nursing note reviewed.  Constitutional:      General: She is not in acute distress.    Appearance: Normal appearance. She is not ill-appearing or toxic-appearing.  HENT:     Head: Normocephalic and atraumatic.     Right Ear: Tympanic membrane, ear canal and external ear normal.     Left Ear: Tympanic membrane, ear canal and external ear normal.     Nose: Congestion present. No rhinorrhea.     Mouth/Throat:     Mouth: Mucous membranes are moist.     Pharynx:  Oropharynx is clear. No oropharyngeal exudate or posterior oropharyngeal erythema.  Eyes:  General: No scleral icterus.    Extraocular Movements: Extraocular movements intact.  Cardiovascular:     Rate and Rhythm: Normal rate and regular rhythm.  Pulmonary:     Effort: Pulmonary effort is normal. No respiratory distress.     Breath sounds: Normal breath sounds. No wheezing, rhonchi or rales.  Musculoskeletal:     Cervical back: Normal range of motion and neck supple.  Lymphadenopathy:     Cervical: No cervical adenopathy.  Skin:    General: Skin is warm and dry.     Coloration: Skin is not jaundiced or pale.     Findings: No erythema or rash.  Neurological:     Mental Status: She is alert and oriented to person, place, and time.  Psychiatric:        Behavior: Behavior is cooperative.      UC Treatments / Results  Labs (all labs ordered are listed, but only abnormal results are displayed) Labs Reviewed  POC COVID19/FLU A&B COMBO    EKG   Radiology No results found.  Procedures Procedures (including critical care time)  Medications Ordered in UC Medications - No data to display  Initial Impression / Assessment and Plan / UC Course  I have reviewed the triage vital signs and the nursing notes.  Pertinent labs & imaging results that were available during my care of the patient were reviewed by me and considered in my medical decision making (see chart for details).   Patient is well-appearing, normotensive, afebrile, not tachycardic, not tachypneic, oxygenating well on room air.    1. Viral URI with cough Suspect viral etiology Vitals and exam are reassuring today COVID-19 and influenza testing is negative Supportive care discussed with patient, start cough suppressant medication and antiemetic as needed ER and return precautions discussed Work excuse provided  The patient was given the opportunity to ask questions.  All questions answered to their  satisfaction.  The patient is in agreement to this plan.   Final Clinical Impressions(s) / UC Diagnoses   Final diagnoses:  Viral URI with cough     Discharge Instructions      You have a viral upper respiratory infection.  Symptoms should improve over the next week to 10 days.  If you develop chest pain or shortness of breath, go to the emergency room.  COVID-19 and influenza testing is negative today.  Some things that can make you feel better are: - Increased rest - Increasing fluid with water/sugar free electrolytes - Acetaminophen  and ibuprofen  as needed for fever/pain - Salt water gargling, chloraseptic spray and throat lozenges - OTC guaifenesin (Mucinex) 600 mg twice daily - Saline sinus flushes or a neti pot - Humidifying the air - Zofran  every 8 hours as needed for nausea/vomiting     ED Prescriptions     Medication Sig Dispense Auth. Provider   benzonatate  (TESSALON  PERLES) 100 MG capsule Take 1 capsule (100 mg total) by mouth 3 (three) times daily as needed. Do not take with alcohol or while driving or operating heavy machinery.  May cause drowsiness. 20 capsule Thena Fireman A, NP   ondansetron  (ZOFRAN -ODT) 4 MG disintegrating tablet Take 1 tablet (4 mg total) by mouth every 6 (six) hours as needed for nausea or vomiting. 20 tablet Wilhemena Harbour, NP      PDMP not reviewed this encounter.   Wilhemena Harbour, NP 07/31/23 2037

## 2023-08-01 ENCOUNTER — Ambulatory Visit (INDEPENDENT_AMBULATORY_CARE_PROVIDER_SITE_OTHER): Payer: BC Managed Care – PPO

## 2023-08-01 ENCOUNTER — Ambulatory Visit
Admission: RE | Admit: 2023-08-01 | Discharge: 2023-08-01 | Disposition: A | Discharge status: Home / Self Care | Payer: BC Managed Care – PPO | Source: Ambulatory Visit | Attending: Family Medicine | Admitting: Family Medicine

## 2023-08-01 VITALS — BP 108/70 | HR 96 | Temp 98.6°F | Resp 16

## 2023-08-01 DIAGNOSIS — J209 Acute bronchitis, unspecified: Secondary | ICD-10-CM | POA: Diagnosis not present

## 2023-08-01 DIAGNOSIS — R051 Acute cough: Secondary | ICD-10-CM

## 2023-08-01 DIAGNOSIS — J40 Bronchitis, not specified as acute or chronic: Secondary | ICD-10-CM | POA: Diagnosis not present

## 2023-08-01 DIAGNOSIS — R059 Cough, unspecified: Secondary | ICD-10-CM | POA: Diagnosis not present

## 2023-08-01 MED ORDER — PROMETHAZINE-DM 6.25-15 MG/5ML PO SYRP: 5.0000 mL | ORAL_SOLUTION | Freq: Three times a day (TID) | ORAL | 0 refills | Status: DC | PRN

## 2023-08-01 MED ORDER — PREDNISONE 20 MG PO TABS
ORAL_TABLET | ORAL | 0 refills | Status: DC
Start: 2023-08-01 — End: 2023-08-09

## 2023-08-01 MED ORDER — CETIRIZINE HCL 10 MG PO TABS: 10.0000 mg | ORAL_TABLET | Freq: Every day | ORAL | 0 refills | Status: AC

## 2023-08-01 MED ORDER — ALBUTEROL SULFATE HFA 108 (90 BASE) MCG/ACT IN AERS: 2.0000 | INHALATION_SPRAY | Freq: Four times a day (QID) | RESPIRATORY_TRACT | 0 refills | Status: AC | PRN

## 2023-08-01 NOTE — Discharge Instructions (Addendum)
Will update you with your chest x-ray results later today.  Recommend starting the prednisone course and use an albuterol inhaler as needed.  Take Zyrtec long-term.  Use cough syrup as needed.  Continue to remove any respiratory irritants you find in your home.  Push fluids and get rest when possible.

## 2023-08-01 NOTE — ED Triage Notes (Signed)
Pt reports cough with blood spots, fever, chills, vomiting when coughing, fatigue, lightheadedness and sore throat x 3 days. Reports she was seeing at another UC yesterday. Zofran and Tessalon prescribed yesterday, pt has not picked up those meds.   Negative Flu and COVID yesterday at the UC.

## 2023-08-01 NOTE — ED Provider Notes (Signed)
[facility]  Note:  This document was prepared using Conservation officer, historic buildings and may include unintentional dictation errors.  MRN: [id number] DOB: 1991/03/24  Subjective:   Ashley Greene is a 33 y.o. female presenting for 4 day history of acute onset recurrent so congestion, chest congestion, coughing spells that intermittently lead to posttussive emesis, wheezing.  Has also had some lightheadedness, fatigue, malaise, throat pain.  Was seen yesterday and had negative COVID and flu testing.  No known history of asthma.  No smoking or exposure to smoke.  Patient has changed the air filters on her hitting an HVAC units.  Has previously responded well when she has an albuterol inhaler, needs a refill.  No current facility-administered medications for this encounter.  Current Outpatient Medications:    acyclovir (ZOVIRAX) 200 MG capsule, Take 200 mg by mouth every 4 hours while awake(5 times daily) for 5 days.  Initiate at earliest sign of recurrence of genital herpez (Patient not taking: Reported on 07/31/2023), Disp: 25 capsule, Rfl: 2   albuterol (VENTOLIN HFA) 108 (90 Base) MCG/ACT inhaler, Inhale 2 puffs into the lungs every 6 (six) hours as needed for wheezing or shortness of breath. (Patient not taking: Reported on 10/28/2022), Disp: 8 g, Rfl: 0   benzonatate (TESSALON PERLES) 100 MG capsule, Take 1 capsule (100 mg total) by mouth 3 (three) times daily as needed. Do not take with alcohol or while driving or operating heavy machinery.  May cause drowsiness., Disp: 20 capsule, Rfl: 0   cetirizine (ZYRTEC ALLERGY) 10 MG tablet, Take 1 tablet (10 mg total) by mouth daily., Disp: 30 tablet, Rfl: 0   fluticasone (FLONASE) 50 MCG/ACT nasal spray, Place 2 sprays into both nostrils daily., Disp: 16 g, Rfl: 6   ibuprofen (ADVIL) 800 MG tablet, Take 1 tablet (800 mg total) by mouth every 8 (eight) hours as needed., Disp: 30 tablet, Rfl: 0   ondansetron (ZOFRAN-ODT) 4 MG  disintegrating tablet, Take 1 tablet (4 mg total) by mouth every 6 (six) hours as needed for nausea or vomiting., Disp: 20 tablet, Rfl: 0   Prenatal Vit-Fe Fumarate-FA (PRENATAL VITAMINS PO), Take by mouth. (Patient not taking: Reported on 12/09/2021), Disp: , Rfl:    Allergies  Allergen Reactions   Amoxicillin Diarrhea    Stomach upset    Penicillins Other (See Comments)   Valtrex [Valacyclovir]     Stomach upset     Past Medical History:  Diagnosis Date   Herpes simplex virus (HSV) infection    Medical history non-contributory    Positive GBS test 04/27/2020   Declines antibiotics, counseled   Post term pregnancy over 40 weeks 05/19/2020   Rubella immune 11/11/2019   Supervision of normal first pregnancy, antepartum 10/15/2019    Nursing Staff Provider Office Location [facility] Dating   LMP Language  English  Anatomy US   normal Flu Vaccine  Declined-11/11/19 Genetic Screen  NIPS: normal female AFP: negtive  First Screen:  Quad:   TDaP vaccine  03/17/20 Hgb A1C or  GTT Early  n/a Third trimester neg Rhogam  02/25/20   LAB RESULTS    Blood Type   ONeg Feeding Plan Breast  Antibody  Neg Contraception None Rubella   Immune Circumc     Past Surgical History:  Procedure Laterality Date   WISDOM TOOTH EXTRACTION      Family History  Problem Relation Age of Onset   Healthy Mother    Healthy Father    Liver cancer  Paternal Grandfather    Heart attack Paternal Grandmother    Stroke Paternal Grandmother    Dementia Paternal Grandmother    Bone cancer Maternal Grandfather    Healthy Half-Brother    Healthy Half-Sister     Social History   Tobacco Use   Smoking status: Never   Smokeless tobacco: Never  Vaping Use   Vaping status: Never Used  Substance Use Topics   Alcohol use: No   Drug use: No    ROS   Objective:   Vitals: BP 108/70 (BP Location: Left Arm)   Pulse 96   Temp 98.6 F (37 C) (Oral)   Resp 16   LMP 07/23/2023 (Exact Date)   SpO2 97%   Physical  Exam Constitutional:      General: She is not in acute distress.    Appearance: Normal appearance. She is well-developed and normal weight. She is not ill-appearing, toxic-appearing or diaphoretic.  HENT:     Head: Normocephalic and atraumatic.     Right Ear: Tympanic membrane, ear canal and external ear normal. No drainage or tenderness. No middle ear effusion. There is no impacted cerumen. Tympanic membrane is not erythematous or bulging.     Left Ear: Tympanic membrane, ear canal and external ear normal. No drainage or tenderness.  No middle ear effusion. There is no impacted cerumen. Tympanic membrane is not erythematous or bulging.     Nose: Nose normal. No congestion or rhinorrhea.     Mouth/Throat:     Mouth: Mucous membranes are moist. No oral lesions.     Pharynx: No pharyngeal swelling, oropharyngeal exudate, posterior oropharyngeal erythema or uvula swelling.     Tonsils: No tonsillar exudate or tonsillar abscesses.     Comments: Thick streaks of drainage overlying pharynx. Eyes:     General: No scleral icterus.       Right eye: No discharge.        Left eye: No discharge.     Extraocular Movements: Extraocular movements intact.     Right eye: Normal extraocular motion.     Left eye: Normal extraocular motion.     Conjunctiva/sclera: Conjunctivae normal.  Cardiovascular:     Rate and Rhythm: Normal rate and regular rhythm.     Heart sounds: Normal heart sounds. No murmur heard.    No friction rub. No gallop.  Pulmonary:     Effort: Pulmonary effort is normal. No respiratory distress.     Breath sounds: No stridor. No wheezing, rhonchi or rales.     Comments: Decreased lung sounds in bibasilar fields. Chest:     Chest wall: No tenderness.  Musculoskeletal:     Cervical back: Normal range of motion and neck supple.  Lymphadenopathy:     Cervical: No cervical adenopathy.  Skin:    General: Skin is warm and dry.  Neurological:     General: No focal deficit present.      Mental Status: She is alert and oriented to person, place, and time.  Psychiatric:        Mood and Affect: Mood normal.        Behavior: Behavior normal.     Results for orders placed or performed during the hospital encounter of 07/31/23 (from the past 24 hours)  POC Covid19/Flu A&B Antigen     Status: None   Collection Time: 07/31/23  8:01 PM  Result Value Ref Range   Influenza A Antigen, POC Negative Negative   Influenza B Antigen, POC Negative Negative  Covid Antigen, POC Negative Negative    Assessment and Plan :   PDMP not reviewed this encounter.  1. Acute bronchitis, unspecified organism   2. Acute cough   3. Bronchitis    X-ray over-read was pending at time of discharge.  Recommend an oral prednisone course, albuterol for recurrent bronchitis.  Suspect underlying reactive airway.  Recommend general supportive care.  Continue to monitor and remove respiratory irritants in the home.  Counseled patient on potential for adverse effects with medications prescribed/recommended today, ER and return-to-clinic precautions discussed, patient verbalized understanding.    Wallis Bamberg, PA-C 08/01/23 1228

## 2023-08-03 ENCOUNTER — Telehealth: Payer: Self-pay

## 2023-08-03 DIAGNOSIS — J029 Acute pharyngitis, unspecified: Secondary | ICD-10-CM | POA: Diagnosis not present

## 2023-08-03 DIAGNOSIS — J040 Acute laryngitis: Secondary | ICD-10-CM | POA: Diagnosis not present

## 2023-08-03 DIAGNOSIS — J40 Bronchitis, not specified as acute or chronic: Secondary | ICD-10-CM | POA: Diagnosis not present

## 2023-08-03 NOTE — Telephone Encounter (Signed)
Pt calls stating ventolin is not covered by her insurance and would like to change it. Pt's pharmacy is notified. Pharmacist states they changed the medication to something her insurance would cover. Pharmacy has notified the patient.

## 2023-08-09 ENCOUNTER — Telehealth: Payer: BC Managed Care – PPO | Admitting: Nurse Practitioner

## 2023-08-09 ENCOUNTER — Encounter: Payer: Self-pay | Admitting: Nurse Practitioner

## 2023-08-09 VITALS — Ht 62.0 in | Wt 125.0 lb

## 2023-08-09 DIAGNOSIS — J40 Bronchitis, not specified as acute or chronic: Secondary | ICD-10-CM

## 2023-08-09 DIAGNOSIS — Z09 Encounter for follow-up examination after completed treatment for conditions other than malignant neoplasm: Secondary | ICD-10-CM | POA: Insufficient documentation

## 2023-08-09 MED ORDER — BENZONATATE 200 MG PO CAPS: 200.0000 mg | ORAL_CAPSULE | Freq: Three times a day (TID) | ORAL | 0 refills | Status: AC | PRN

## 2023-08-09 MED ORDER — AZITHROMYCIN 250 MG PO TABS: ORAL_TABLET | ORAL | 0 refills | Status: AC

## 2023-08-09 NOTE — Patient Instructions (Signed)
1. Bronchitis (Primary)  - azithromycin (ZITHROMAX) 250 MG tablet; Take 2 tablets on day 1, then 1 tablet daily on days 2 through 5  Dispense: 6 tablet; Refill: 0 - benzonatate (TESSALON) 200 MG capsule; Take 1 capsule (200 mg total) by mouth 3 (three) times daily as needed for cough.  Dispense: 20 capsule; Refill: 0  Sore throat.  Please get over-the-counter sore throat relief like Chloraseptic spray and use as needed.  Also encouraged salt water gargling, drinking warm water, soup.  Okay to take Tylenol 650 mg every 6 hours as needed.   It is important that you exercise regularly at least 30 minutes 5 times a week as tolerated  Think about what you will eat, plan ahead. Choose " clean, green, fresh or frozen" over canned, processed or packaged foods which are more sugary, salty and fatty. 70 to 75% of food eaten should be vegetables and fruit. Three meals at set times with snacks allowed between meals, but they must be fruit or vegetables. Aim to eat over a 12 hour period , example 7 am to 7 pm, and STOP after  your last meal of the day. Drink water,generally about 64 ounces per day, no other drink is as healthy. Fruit juice is best enjoyed in a healthy way, by EATING the fruit.  Thanks for choosing [facility] we consider it a privelige to serve you.

## 2023-08-09 NOTE — Assessment & Plan Note (Signed)
 Hospital chart reviewed, including discharge summary Medications reconciled and reviewed with the patient in detail

## 2023-08-09 NOTE — Progress Notes (Signed)
Virtual Visit via Telephone Note  I connected with Ashley Greene @ on 08/09/23 at  2:40 PM EST by video and verified that I am speaking with the correct person using two identifiers. I spent 10 minutes on this telehealth encounter  Location: Patient: home Provider: office   I discussed the limitations, risks, security and privacy concerns of performing an evaluation and management service by telephone and the availability of in person appointments. I also discussed with the patient that there may be a patient responsible charge related to this service. The patient expressed understanding and agreed to proceed.   History of Present Illness: Ashley Greene  has a past medical history of Herpes simplex virus (HSV) infection, Medical history non-contributory, Positive GBS test (04/27/2020), Post term pregnancy over 40 weeks (05/19/2020), Rubella immune (11/11/2019), and Supervision of normal first pregnancy, antepartum (10/15/2019).  Patient presents for follow-up for bronchitis.  She has been to the urgent care twice recently for acute bronchitis, laryngitis.  She was treated with steroid twice.  Stated that her symptoms got better while taking the steroids but her symptoms have since returned after completion of the steroids.  She is still having sore throat, fever and chills on and off, cough with mucus tinged with blood.  She does not smoke and has no history of asthma.  She was treated for pneumonia around Christmas 2024, her recent chest x-ray was normal.  Uses albuterol inhaler as needed for wheezing, currently not taking any cough medication   Observations/Objective: Patient alert and oriented, no sign of distress noted  Assessment and Plan: Encounter for examination following treatment at hospital Hospital chart reviewed, including discharge summary Medications reconciled and reviewed with the patient in detail   Bronchitis 1. Bronchitis (Primary)  - azithromycin (ZITHROMAX) 250 MG tablet;  Take 2 tablets on day 1, then 1 tablet daily on days 2 through 5  Dispense: 6 tablet; Refill: 0 - benzonatate (TESSALON) 200 MG capsule; Take 1 capsule (200 mg total) by mouth 3 (three) times daily as needed for cough.  Dispense: 20 capsule; Refill: 0 Encouraged to take Tylenol as needed for pain, fever, gargle with salt water and use OTC sore throat relief medication like Chloraseptic spray Follow-up in the office if no improvement in symptoms after full completion of antibiotics ordered    Follow Up Instructions:    I discussed the assessment and treatment plan with the patient. The patient was provided an opportunity to ask questions and all were answered. The patient agreed with the plan and demonstrated an understanding of the instructions.   The patient was advised to call back or seek an in-person evaluation if the symptoms worsen or if the condition fails to improve as anticipated.

## 2023-08-09 NOTE — Assessment & Plan Note (Signed)
1. Bronchitis (Primary)  - azithromycin (ZITHROMAX) 250 MG tablet; Take 2 tablets on day 1, then 1 tablet daily on days 2 through 5  Dispense: 6 tablet; Refill: 0 - benzonatate (TESSALON) 200 MG capsule; Take 1 capsule (200 mg total) by mouth 3 (three) times daily as needed for cough.  Dispense: 20 capsule; Refill: 0 Encouraged to take Tylenol as needed for pain, fever, gargle with salt water and use OTC sore throat relief medication like Chloraseptic spray Follow-up in the office if no improvement in symptoms after full completion of antibiotics ordered

## 2023-08-15 ENCOUNTER — Other Ambulatory Visit: Payer: BC Managed Care – PPO

## 2023-08-15 ENCOUNTER — Other Ambulatory Visit: Payer: Self-pay | Admitting: Nurse Practitioner

## 2023-08-15 DIAGNOSIS — R7989 Other specified abnormal findings of blood chemistry: Secondary | ICD-10-CM

## 2023-08-16 LAB — BASIC METABOLIC PANEL
BUN/Creatinine Ratio: 21 (ref 9–23)
BUN: 15 mg/dL (ref 6–20)
CO2: 20 mmol/L (ref 20–29)
Calcium: 9 mg/dL (ref 8.7–10.2)
Chloride: 104 mmol/L (ref 96–106)
Creatinine, Ser: 0.72 mg/dL (ref 0.57–1.00)
Glucose: 103 mg/dL — ABNORMAL HIGH (ref 70–99)
Potassium: 4.1 mmol/L (ref 3.5–5.2)
Sodium: 140 mmol/L (ref 134–144)
eGFR: 115 mL/min/{1.73_m2} (ref >59)

## 2023-08-17 ENCOUNTER — Other Ambulatory Visit: Payer: Self-pay

## 2023-09-22 ENCOUNTER — Ambulatory Visit: Payer: BC Managed Care – PPO | Admitting: Nurse Practitioner

## 2023-11-01 ENCOUNTER — Ambulatory Visit: Admitting: Nurse Practitioner

## 2023-12-20 ENCOUNTER — Ambulatory Visit: Payer: Self-pay | Admitting: Nurse Practitioner

## 2024-07-05 ENCOUNTER — Other Ambulatory Visit: Payer: Self-pay

## 2024-07-05 ENCOUNTER — Emergency Department (HOSPITAL_COMMUNITY)

## 2024-07-05 ENCOUNTER — Emergency Department (HOSPITAL_COMMUNITY)
Admission: EM | Admit: 2024-07-05 | Discharge: 2024-07-05 | Disposition: A | Discharge status: Home / Self Care | Attending: Emergency Medicine | Admitting: Emergency Medicine

## 2024-07-05 ENCOUNTER — Encounter (HOSPITAL_COMMUNITY): Payer: Self-pay

## 2024-07-05 DIAGNOSIS — R41840 Attention and concentration deficit: Secondary | ICD-10-CM | POA: Insufficient documentation

## 2024-07-05 DIAGNOSIS — R519 Headache, unspecified: Secondary | ICD-10-CM | POA: Insufficient documentation

## 2024-07-05 LAB — URINALYSIS, ROUTINE W REFLEX MICROSCOPIC
Bilirubin Urine: NEGATIVE
Glucose, UA: NEGATIVE mg/dL
Hgb urine dipstick: NEGATIVE
Ketones, ur: NEGATIVE mg/dL
Leukocytes,Ua: NEGATIVE
Nitrite: NEGATIVE
Protein, ur: NEGATIVE mg/dL
Specific Gravity, Urine: 1.005 (ref 1.005–1.030)
pH: 7 (ref 5.0–8.0)

## 2024-07-05 LAB — HCG, SERUM, QUALITATIVE: Preg, Serum: NEGATIVE

## 2024-07-05 LAB — COMPREHENSIVE METABOLIC PANEL WITH GFR
ALT: 45 U/L — ABNORMAL HIGH (ref 0–44)
AST: 35 U/L (ref 15–41)
Albumin: 4.6 g/dL (ref 3.5–5.0)
Alkaline Phosphatase: 70 U/L (ref 38–126)
Anion gap: 10 (ref 5–15)
BUN: 11 mg/dL (ref 6–20)
CO2: 27 mmol/L (ref 22–32)
Calcium: 8.7 mg/dL — ABNORMAL LOW (ref 8.9–10.3)
Chloride: 101 mmol/L (ref 98–111)
Creatinine, Ser: 0.72 mg/dL (ref 0.44–1.00)
GFR, Estimated: >60 mL/min
Glucose, Bld: 116 mg/dL — ABNORMAL HIGH (ref 70–99)
Potassium: 3.8 mmol/L (ref 3.5–5.1)
Sodium: 137 mmol/L (ref 135–145)
Total Bilirubin: 0.4 mg/dL (ref 0.0–1.2)
Total Protein: 7.9 g/dL (ref 6.5–8.1)

## 2024-07-05 LAB — CBC WITH DIFFERENTIAL/PLATELET
Abs Immature Granulocytes: 0.01 K/uL (ref 0.00–0.07)
Basophils Absolute: 0 K/uL (ref 0.0–0.1)
Basophils Relative: 1 %
Eosinophils Absolute: 0.1 K/uL (ref 0.0–0.5)
Eosinophils Relative: 1 %
HCT: 40.8 % (ref 36.0–46.0)
Hemoglobin: 14.4 g/dL (ref 12.0–15.0)
Immature Granulocytes: 0 %
Lymphocytes Relative: 32 %
Lymphs Abs: 1.6 K/uL (ref 0.7–4.0)
MCH: 31.9 pg (ref 26.0–34.0)
MCHC: 35.3 g/dL (ref 30.0–36.0)
MCV: 90.3 fL (ref 80.0–100.0)
Monocytes Absolute: 0.6 K/uL (ref 0.1–1.0)
Monocytes Relative: 13 %
Neutro Abs: 2.7 K/uL (ref 1.7–7.7)
Neutrophils Relative %: 53 %
Platelets: 315 K/uL (ref 150–400)
RBC: 4.52 MIL/uL (ref 3.87–5.11)
RDW: 11.8 % (ref 11.5–15.5)
WBC: 5.1 K/uL (ref 4.0–10.5)
nRBC: 0 % (ref 0.0–0.2)

## 2024-07-05 NOTE — ED Provider Triage Note (Signed)
 Emergency Medicine Provider Triage Evaluation Note  Ashley Greene , a 34 y.o. female  was evaluated in triage.  Pt complains of intermittent confusion, worsening headache, light sensitivity, as well as nausea. Patient has a history of multiple head injuries however denies acute head injury. Denies vision changes or infectious symptoms. Denies stiff neck.   Review of Systems  Positive: Confusion, headache, light sensitivity, nausea Negative: Vision changes, trauma/injury, stiff neck  Physical Exam  BP 111/68 (BP Location: Right Arm)   Pulse 83   Temp 97.7 F (36.5 C)   Resp 19   Ht 5' 2 (1.575 m)   Wt 56.7 kg   SpO2 100%   BMI 22.86 kg/m  Gen:   Awake, no distress   Resp:  Normal effort  MSK:   Moves extremities without difficulty  Other:    Medical Decision Making  Medically screening exam initiated at 5:23 PM.  Appropriate orders placed.  Ashley Greene was informed that the remainder of the evaluation will be completed by another provider, this initial triage assessment does not replace that evaluation, and the importance of remaining in the ED until their evaluation is complete.  Orders: CBC, CMP, Pregnancy, UA, CT head   Janetta Terrall FALCON, PA-C 07/05/24 1725

## 2024-07-05 NOTE — Discharge Instructions (Signed)
 As we discussed, follow up with your doctor if symptoms persist. Return to the ED with any new or concerning symptoms.

## 2024-07-05 NOTE — ED Triage Notes (Signed)
 Pt came in via POV d/t feeling brain fog, moments of confusion since last head injury approx 1 yr ago, has Hx of more concussions as well. A/Ox4, rates her head pain 6/10 during triage, does feel nauseous, light sensitive, not much problem with loud sounds.

## 2024-07-05 NOTE — ED Triage Notes (Signed)
 Pt reports she is here due to developing headaches the last week and has been having ongoing confusion following a head injury about a year ago.

## 2024-07-05 NOTE — ED Provider Notes (Signed)
 " [place] [facility] Provider Note   CSN: [id number] Arrival date & time: 07/05/24  1603     Patient presents with: Confusion since Head injury and Headache   Ashley Greene is a 34 y.o. female.   Patient to ED for evaluation of symptoms of bilateral headache, including occipital and temporal regions, poor concentration, brain fog. Symptoms have been present for about 1 year since a previous head injury. She states the headaches are getting worse which prompted ED evaluation.   The history is provided by the patient. No language interpreter was used.  Headache      Prior to Admission medications  Medication Sig Start Date End Date Taking? Authorizing Provider  acyclovir  (ZOVIRAX ) 200 MG capsule Take 200 mg by mouth every 4 hours while awake(5 times daily) for 5 days.  Initiate at earliest sign of recurrence of genital herpez Patient not taking: Reported on 08/09/2023 12/23/22   Paseda, Folashade R, FNP  albuterol  (VENTOLIN  HFA) 108 (90 Base) MCG/ACT inhaler Inhale 2 puffs into the lungs every 6 (six) hours as needed for wheezing or shortness of breath. 08/01/23   Christopher Savannah, PA-C  benzonatate  (TESSALON ) 200 MG capsule Take 1 capsule (200 mg total) by mouth 3 (three) times daily as needed for cough. 08/09/23   Paseda, Folashade R, FNP  cetirizine  (ZYRTEC  ALLERGY) 10 MG tablet Take 1 tablet (10 mg total) by mouth daily. Patient not taking: Reported on 08/09/2023 08/01/23   Christopher Savannah, PA-C  fluticasone  (FLONASE ) 50 MCG/ACT nasal spray Place 2 sprays into both nostrils daily. Patient not taking: Reported on 08/09/2023 10/22/22   Gladis Mustard, FNP  ibuprofen  (ADVIL ) 800 MG tablet Take 1 tablet (800 mg total) by mouth every 8 (eight) hours as needed. Patient not taking: Reported on 08/09/2023 05/25/20   Vertell Recardo HERO, MD  ondansetron  (ZOFRAN -ODT) 4 MG disintegrating tablet Take 1 tablet (4 mg total) by mouth every 6 (six) hours as needed for nausea  or vomiting. Patient not taking: Reported on 08/09/2023 07/31/23   Chandra Harlene LABOR, NP  Prenatal Vit-Fe Fumarate-FA (PRENATAL VITAMINS PO) Take by mouth. Patient not taking: Reported on 12/09/2021    [provider]    Allergies: Amoxicillin , Penicillins, and Valtrex [valacyclovir]    Review of Systems  Neurological:  Positive for headaches.    Updated Vital Signs BP 116/78 (BP Location: Right Arm)   Pulse 71   Temp 97.8 F (36.6 C)   Resp 18   Ht 5' 2 (1.575 m)   Wt 56.7 kg   SpO2 100%   BMI 22.86 kg/m   Physical Exam Constitutional:      Appearance: She is well-developed.  HENT:     Head: Normocephalic.  Cardiovascular:     Rate and Rhythm: Normal rate and regular rhythm.     Heart sounds: No murmur heard. Pulmonary:     Effort: Pulmonary effort is normal.     Breath sounds: Normal breath sounds. No wheezing, rhonchi or rales.  Abdominal:     General: Bowel sounds are normal.     Palpations: Abdomen is soft.     Tenderness: There is no abdominal tenderness. There is no guarding or rebound.  Musculoskeletal:        General: Normal range of motion.     Cervical back: Normal range of motion and neck supple.  Skin:    General: Skin is warm and dry.  Neurological:     General: No focal deficit present.  Mental Status: She is alert and oriented to person, place, and time.     GCS: GCS eye subscore is 4. GCS verbal subscore is 5. GCS motor subscore is 6.     Cranial Nerves: No cranial nerve deficit.     Sensory: No sensory deficit.     Motor: No pronator drift.     Coordination: Coordination normal.     Gait: Gait normal.     (all labs ordered are listed, but only abnormal results are displayed) Labs Reviewed  COMPREHENSIVE METABOLIC PANEL WITH GFR - Abnormal; Notable for the following components:      Result Value   Glucose, Bld 116 (*)    Calcium 8.7 (*)    ALT 45 (*)    All other components within normal limits  URINALYSIS, ROUTINE W REFLEX  MICROSCOPIC - Abnormal; Notable for the following components:   Color, Urine STRAW (*)    All other components within normal limits  CBC WITH DIFFERENTIAL/PLATELET  HCG, SERUM, QUALITATIVE    EKG: None  Radiology: CT Head Wo Contrast Result Date: 07/05/2024 EXAM: CT HEAD WITHOUT CONTRAST 07/05/2024 07:05:37 PM TECHNIQUE: CT of the head was performed without the administration of intravenous contrast. Automated exposure control, iterative reconstruction, and/or weight based adjustment of the mA/kV was utilized to reduce the radiation dose to as low as reasonably achievable. COMPARISON: None available. CLINICAL HISTORY: intermittent confusion, headache, light sensitivity, nausea FINDINGS: BRAIN AND VENTRICLES: No acute hemorrhage. No evidence of acute infarct. No hydrocephalus. No extra-axial collection. No mass effect or midline shift. ORBITS: No acute abnormality. SINUSES: No acute abnormality. SOFT TISSUES AND SKULL: No acute soft tissue abnormality. No skull fracture. IMPRESSION: 1. No acute intracranial abnormality. Electronically signed by: Franky Stanford MD 07/05/2024 07:29 PM EST RP Workstation: [id number]     Procedures   Medications Ordered in the ED - No data to display  Clinical Course as of 07/05/24 2223  Fri Jul 05, 2024  2220 Patient with symptoms for one year as detailed in HPI. No recent fever, vomiting. Labs reassuring. VSS. Head CT negative. No neurologic deficits on exam. She is felt appropriate for discharge home. Discussed PCP follow up.  [SU]    Clinical Course User Index [SU] Odell Balls, PA-C                                 Medical Decision Making      Final diagnoses:  Acute nonintractable headache, unspecified headache type  Poor concentration    ED Discharge Orders     None          Odell Balls RIGGERS 07/05/24 2223    Patsey Lot, MD 07/09/24 [phone]  "

## 2024-07-09 ENCOUNTER — Telehealth: Payer: Self-pay

## 2024-07-09 NOTE — Transitions of Care (Post Inpatient/ED Visit) (Signed)
" ° °  07/09/2024  Name: Ashley Greene MRN: [id number] DOB: Sep 27, 1990  Today's TOC FU Call Status: Today's TOC FU Call Status:: Unsuccessful Call (1st Attempt) Unsuccessful Call (1st Attempt) Date: 07/09/24  Attempted to reach the patient regarding the most recent Inpatient/ED visit.  Follow Up Plan: Additional outreach attempts will be made to reach the patient to complete the Transitions of Care (Post Inpatient/ED visit) call.   Signature Geroge Hahn, CMA   "

## 2024-07-10 ENCOUNTER — Telehealth: Payer: Self-pay

## 2024-07-10 NOTE — Transitions of Care (Post Inpatient/ED Visit) (Signed)
" ° °  07/10/2024  Name: Ashley Greene MRN: [id number] DOB: [birth year]  Today's TOC FU Call Status: Today's TOC FU Call Status:: Unsuccessful Call (2nd Attempt) Unsuccessful Call (2nd Attempt) Date: 07/10/24  Attempted to reach the patient regarding the most recent Inpatient/ED visit.  Follow Up Plan: Additional outreach attempts will be made to reach the patient to complete the Transitions of Care (Post Inpatient/ED visit) call.   Signature  Suzen Shove   CMA II  "

## 2024-07-11 ENCOUNTER — Telehealth: Payer: Self-pay

## 2024-07-11 NOTE — Transitions of Care (Post Inpatient/ED Visit) (Cosign Needed)
" ° °  07/11/2024  Name: Ashley Greene MRN: [id number] DOB: May 30, 1991  Today's TOC FU Call Status: Today's TOC FU Call Status:: Unsuccessful Call (3rd Attempt) Unsuccessful Call (3rd Attempt) Date: 07/11/24  Attempted to reach the patient regarding the most recent Inpatient/ED visit.  Follow Up Plan: No further outreach attempts will be made at this time. We have been unable to contact the patient.  Signature   Suzen Shove   CMA II  "

## 2024-08-02 ENCOUNTER — Ambulatory Visit: Admitting: Nurse Practitioner
